# Patient Record
Sex: Female | Born: 1945 | Race: Black or African American | Hispanic: No | Smoking: Former smoker
Health system: Southern US, Community
[De-identification: ages and names within clinical notes are randomized; demographics above are authoritative.]

## PROBLEM LIST (undated history)

## (undated) DIAGNOSIS — R011 Cardiac murmur, unspecified: Secondary | ICD-10-CM

## (undated) DIAGNOSIS — M1990 Unspecified osteoarthritis, unspecified site: Secondary | ICD-10-CM

## (undated) DIAGNOSIS — J302 Other seasonal allergic rhinitis: Secondary | ICD-10-CM

## (undated) DIAGNOSIS — J45909 Unspecified asthma, uncomplicated: Secondary | ICD-10-CM

## (undated) DIAGNOSIS — Z8489 Family history of other specified conditions: Secondary | ICD-10-CM

## (undated) DIAGNOSIS — I10 Essential (primary) hypertension: Secondary | ICD-10-CM

## (undated) DIAGNOSIS — H269 Unspecified cataract: Secondary | ICD-10-CM

## (undated) DIAGNOSIS — T7840XA Allergy, unspecified, initial encounter: Secondary | ICD-10-CM

## (undated) DIAGNOSIS — Z5189 Encounter for other specified aftercare: Secondary | ICD-10-CM

## (undated) HISTORY — PX: CATARACT EXTRACTION: SUR2

## (undated) HISTORY — DX: Unspecified asthma, uncomplicated: J45.909

## (undated) HISTORY — DX: Encounter for other specified aftercare: Z51.89

## (undated) HISTORY — DX: Unspecified cataract: H26.9

## (undated) HISTORY — PX: TONSILLECTOMY: SUR1361

## (undated) HISTORY — DX: Cardiac murmur, unspecified: R01.1

## (undated) HISTORY — DX: Allergy, unspecified, initial encounter: T78.40XA

## (undated) HISTORY — PX: ABDOMINAL HYSTERECTOMY: SHX81

## (undated) HISTORY — PX: APPENDECTOMY: SHX54

---

## 2004-07-23 ENCOUNTER — Inpatient Hospital Stay (HOSPITAL_COMMUNITY): Admission: EM | Admit: 2004-07-23 | Discharge: 2004-07-25 | Payer: Self-pay | Admitting: Emergency Medicine

## 2006-05-15 ENCOUNTER — Ambulatory Visit: Payer: Self-pay

## 2007-03-18 ENCOUNTER — Emergency Department (HOSPITAL_COMMUNITY): Admission: EM | Admit: 2007-03-18 | Discharge: 2007-03-18 | Payer: Self-pay | Admitting: Emergency Medicine

## 2007-07-22 ENCOUNTER — Ambulatory Visit: Payer: Self-pay

## 2009-03-01 ENCOUNTER — Ambulatory Visit: Payer: Self-pay

## 2009-10-20 ENCOUNTER — Ambulatory Visit: Payer: Self-pay | Admitting: Emergency Medicine

## 2010-04-03 ENCOUNTER — Ambulatory Visit: Payer: Self-pay | Admitting: Family Medicine

## 2010-04-18 ENCOUNTER — Ambulatory Visit: Payer: Self-pay | Admitting: Family Medicine

## 2010-10-20 NOTE — H&P (Signed)
NAME:  AUBRINA, NIEMAN              ACCOUNT NO.:  [id number]   MEDICAL RECORD NO.:  [id number]          PATIENT TYPE:  INP   LOCATION:  A208                          FACILITY:  [facility]   PHYSICIAN:  Margaretmary Dys, M.D.DATE OF BIRTH:  12-11-1945   DATE OF ADMISSION:  07/23/2004  DATE OF DISCHARGE:  LH                                HISTORY & PHYSICAL   ADMISSION DIAGNOSES:  1.  Acute bronchitis.  2.  Fever.  3.  Hypoxic with respiratory failure.   CHIEF COMPLAINT:  Cough and fever of 2 days duration.   HISTORY OF PRESENT ILLNESS:  Mis Fenstermaker is a 65 year old African-American  female who recently moved from [place]. She does not have a primary care  physician at this time. She presented to the emergency department with  complaint of a 2-day history of cough productive of brownish sputum. She  also had some fever. She also felt dizzy. She had some nausea but no  vomiting. She gave complaint of dizziness but no headache or light  headedness. Denies any frequency, urgency or dysuria. She reports no one is  sick around her with flu-like symptoms.  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   Initial evaluation in the emergency room revealed significant fever with a  temperature of 103.5. A chest x-ray shows a peribronchial thickening but no  acute infiltrates were noted. She was however hypoxic with oxygen  saturations of 89% on room air. The plan is to admit her for bronchitis  which could probably develop pain and pneumonia.   REVIEW OF SYSTEMS:  Otherwise negative except as mentioned in the history of  present illness above.   PAST MEDICAL HISTORY:  1.  Arthritis.  2.  Back pain.  3.  Hypertension.   MEDICATIONS:  1.  Travatan which is blood pressure medication. The patient did not bring      the bottle and is unaware of the dose and I was unable to search for      this drug.  2.  _______  3.  Celebrex  4.  Singulair.   ALLERGIES:  PENICILLIN - causes a rash. CODEINE - causes hallucinations.   FAMILY HISTORY:  Significant for history of hypertension, diabetes, coronary  artery disease.   SOCIAL HISTORY:  The patient is married and has one son. Recently relocated  from [place] and lives around [place] now. She is originally from  [place]. Her husband is very sick with diabetes.  She quit smoking in  1987, was smoking about 1/2 pack/day. She drinks alcohol occasionally. She  denies any IV drug abuse or cocaine use.   PHYSICAL EXAMINATION:  GENERAL:  Alert and oriented, comfortable, in mild  respiratory distress.  VITAL SIGNS:  Blood pressure is 136/83, temperature on arrival in the  emergency room was 103.5, pulse 107, respiratory rate of 18, weight is 155  pounds. Oxygen saturations were 89% on room air. This improved to 94% on 2  liters.  HEENT:  Normocephalic, atraumatic. Oral mucosa was dry, no exudates were  noted. No  ulcers were seen.  NECK:  Supple, no jugular venous distension or lymphadenopathy.  LUNGS:  Clear clinically with good air entry bilaterally.  HEART:  S1, S2 regular, no S3, S4, gallops or rubs.  ABDOMEN:  Soft, nontender, bowel sounds were positive. No masses palpable.  EXTREMITIES:  No pitting pedal edema. No calf induration or tenderness was  noted.  CNS EXAM:  Was grossly nonfocal.   LABORATORY DATA:  White blood count was 6.4 thousand, hemoglobin 11.6,  hematocrit 33.3, platelet count 215,000, left shift with neutrophils of 90%.  Sodium is 130 with potassium 3.0, chloride of 99, CO2 of 24, glucose 146,  BUN of 11, creatinine 0.9. Liver function tests were normal.  Urinalysis was  positive for ketones. Positive protein. Leukocytes were negative.  Microscopic shows many bacteria, 3-6 white blood cell count's. Blood  cultures are pending.   Chest x-ray shows no evidence of acute infiltrate.   ASSESSMENT/PLAN:  Ms. Zea is a 65 year old female with symptoms   consistent with acute bronchitis and fever. No evidence of infiltrate on the  chest x-ray. She also has a fairly infected looking urine based on the urine  microscopy.  The plan will be to switch antibiotic coverage to Levaquin 750  IV daily from tomorrow. She does have a history of penicillin allergy and  there is a potential cross-reactivity with penicillin and cephalosporin.  Hopefully the Levaquin should also be able to cover her urinary tract  pathogens.   She does have mild hypoxia which was easily corrected with oxygen. Will  continue and titrate her oxygen to keep her saturations greater than 92%.  Her blood gas on arrival was 7.43, PO2 of 63.8, PCO2 39.9, bicarbonate 25.6,  saturations were 89.1%.   The patient is dehydrated and will hydrate her. She is also hypokalemic and  will replace her potassium.  It is unclear what the name of patient's  antihypertensive, however her blood pressure is fairly reasonable at this  time and will institute a different blood pressure pill if there is cause to  do so. At this time will monitor her blood pressure and await her family to  bring in her medication bottles.      AM/MEDQ  D:  07/23/2004  T:  07/23/2004  Job:  [id number]

## 2010-10-20 NOTE — Discharge Summary (Signed)
NAME:  LEYNA, VANDERKOLK              ACCOUNT NO.:  [id number]   MEDICAL RECORD NO.:  [id number]          PATIENT TYPE:  INP   LOCATION:  A208                          FACILITY:  [facility]   PHYSICIAN:  Calvert Cantor, M.D.     DATE OF BIRTH:  [birth year]   DATE OF ADMISSION:  07/23/2004  DATE OF DISCHARGE:  02/21/2006LH                                 DISCHARGE SUMMARY   PRIMARY CARE PHYSICIAN:  Dr. Bethena Midget.   DISCHARGE DIAGNOSES:  1.  Acute bronchitis.  2.  Hypoxia, secondary to acute bronchitis.  3.  Urinary tract infection.  4.  Diarrhea, status post antibiotic use.   DISCHARGE MEDICATIONS:  1.  Zithromax 500 mg p.o. daily x 3 days.  2.  Phenergan 12.5 mg p.o. q.4-6h. p.r.n. for nausea and vomiting.  3.  The patient has been told to use Pepto-Bismol for her diarrhea.   HOSPITAL COURSE:  This is a 65 year old African-American female who  presented to the ER with cough productive of brownish sputum, shortness of  breath, fever, dizziness and some nausea.  The patient was found to be  having an acute bronchitis episode with some hypoxia.  In addition to this,  she had a urinary tract infection.  She was started on Levaquin 750 mg  daily.  Her chest x-ray did not show any pneumonia.  Also, she was hydrated  with IV fluids.  Over the following day, the patient's condition improved.  She is less short of breath.  She is bringing up yellowish-white sputum now  with her cough, and she is not complaining of any dysuria.  However, she  does admit to diarrhea, which started last night.  She is having about 2-3  episodes through the night.  She said the diarrhea is not profuse; however,  it is loose.  She has had a stool sample drawn for C. diff, and she has been  started on Pepto-Bismol for her diarrhea.  Her antibiotic on discharge is  going to be Zithromax, and, in addition, she will have Phenergan for her  nausea and vomiting.  She states that she has an appointment with Dr.  Bethena Midget for this Friday, who can follow up with her on her acute  bronchitis and diarrhea.  The C. diff will be followed.  If it is positive,  then the patient will be called, and appropriate medication will be  prescribed.   DISCHARGE INSTRUCTIONS:  1.  Drink 6-8 glasses of fluid per day, preferably water.  2.  Take medications as prescribed.      SR/MEDQ  D:  07/25/2004  T:  07/25/2004  Job:  [id number]   cc:   Billee Cashing, MD  [address]  [place]  [place] [address]

## 2012-01-07 ENCOUNTER — Emergency Department: Payer: Self-pay | Admitting: Emergency Medicine

## 2013-10-15 ENCOUNTER — Ambulatory Visit: Payer: Self-pay | Admitting: Family Medicine

## 2013-10-15 DIAGNOSIS — I359 Nonrheumatic aortic valve disorder, unspecified: Secondary | ICD-10-CM

## 2015-07-10 ENCOUNTER — Encounter (HOSPITAL_COMMUNITY): Payer: Self-pay | Admitting: Emergency Medicine

## 2015-07-10 ENCOUNTER — Observation Stay (HOSPITAL_COMMUNITY)
Admission: EM | Admit: 2015-07-10 | Discharge: 2015-07-11 | Disposition: A | Discharge status: Home / Self Care | Payer: Medicare HMO | Attending: Internal Medicine | Admitting: Internal Medicine

## 2015-07-10 ENCOUNTER — Emergency Department (HOSPITAL_COMMUNITY)
Admission: EM | Admit: 2015-07-10 | Discharge: 2015-07-10 | Disposition: A | Discharge status: Home / Self Care | Payer: Medicare HMO | Source: Home / Self Care | Attending: Emergency Medicine | Admitting: Emergency Medicine

## 2015-07-10 ENCOUNTER — Emergency Department (HOSPITAL_COMMUNITY): Payer: Medicare HMO

## 2015-07-10 ENCOUNTER — Encounter (HOSPITAL_COMMUNITY): Payer: Self-pay

## 2015-07-10 DIAGNOSIS — J3489 Other specified disorders of nose and nasal sinuses: Secondary | ICD-10-CM | POA: Diagnosis not present

## 2015-07-10 DIAGNOSIS — R531 Weakness: Secondary | ICD-10-CM | POA: Diagnosis present

## 2015-07-10 DIAGNOSIS — R6889 Other general symptoms and signs: Secondary | ICD-10-CM

## 2015-07-10 DIAGNOSIS — R55 Syncope and collapse: Secondary | ICD-10-CM | POA: Insufficient documentation

## 2015-07-10 DIAGNOSIS — I10 Essential (primary) hypertension: Secondary | ICD-10-CM | POA: Insufficient documentation

## 2015-07-10 DIAGNOSIS — Z79899 Other long term (current) drug therapy: Secondary | ICD-10-CM | POA: Insufficient documentation

## 2015-07-10 DIAGNOSIS — J40 Bronchitis, not specified as acute or chronic: Secondary | ICD-10-CM | POA: Insufficient documentation

## 2015-07-10 DIAGNOSIS — R0602 Shortness of breath: Secondary | ICD-10-CM | POA: Insufficient documentation

## 2015-07-10 DIAGNOSIS — R5383 Other fatigue: Secondary | ICD-10-CM | POA: Insufficient documentation

## 2015-07-10 DIAGNOSIS — M1990 Unspecified osteoarthritis, unspecified site: Secondary | ICD-10-CM | POA: Insufficient documentation

## 2015-07-10 DIAGNOSIS — R05 Cough: Secondary | ICD-10-CM | POA: Diagnosis present

## 2015-07-10 DIAGNOSIS — R062 Wheezing: Secondary | ICD-10-CM

## 2015-07-10 DIAGNOSIS — R011 Cardiac murmur, unspecified: Secondary | ICD-10-CM | POA: Diagnosis present

## 2015-07-10 DIAGNOSIS — R509 Fever, unspecified: Secondary | ICD-10-CM

## 2015-07-10 DIAGNOSIS — Z880 Allergy status to penicillin: Secondary | ICD-10-CM | POA: Diagnosis not present

## 2015-07-10 DIAGNOSIS — Z792 Long term (current) use of antibiotics: Secondary | ICD-10-CM | POA: Insufficient documentation

## 2015-07-10 DIAGNOSIS — I341 Nonrheumatic mitral (valve) prolapse: Secondary | ICD-10-CM | POA: Diagnosis present

## 2015-07-10 DIAGNOSIS — Z8739 Personal history of other diseases of the musculoskeletal system and connective tissue: Secondary | ICD-10-CM | POA: Insufficient documentation

## 2015-07-10 DIAGNOSIS — R054 Cough syncope: Secondary | ICD-10-CM | POA: Diagnosis present

## 2015-07-10 HISTORY — DX: Unspecified osteoarthritis, unspecified site: M19.90

## 2015-07-10 HISTORY — DX: Other seasonal allergic rhinitis: J30.2

## 2015-07-10 HISTORY — DX: Essential (primary) hypertension: I10

## 2015-07-10 MED ORDER — IBUPROFEN 800 MG PO TABS
800.0000 mg | ORAL_TABLET | Freq: Once | ORAL | Status: AC
  Administered 2015-07-10: 800 mg via ORAL

## 2015-07-10 MED ORDER — ACETAMINOPHEN 325 MG PO TABS: 650.0000 mg | ORAL_TABLET | ORAL | Status: DC | PRN

## 2015-07-10 MED ORDER — IBUPROFEN 800 MG PO TABS
ORAL_TABLET | ORAL | Status: AC
  Filled 2015-07-10: qty 1

## 2015-07-10 MED ORDER — ALBUTEROL SULFATE (2.5 MG/3ML) 0.083% IN NEBU
5.0000 mg | INHALATION_SOLUTION | Freq: Once | RESPIRATORY_TRACT | Status: AC
  Administered 2015-07-10: 5 mg via RESPIRATORY_TRACT
  Filled 2015-07-10: qty 6

## 2015-07-10 MED ORDER — ALBUTEROL SULFATE HFA 108 (90 BASE) MCG/ACT IN AERS
INHALATION_SPRAY | RESPIRATORY_TRACT | Status: AC
  Administered 2015-07-10: 21:00:00
  Filled 2015-07-10: qty 6.7

## 2015-07-10 MED ORDER — PREDNISONE 20 MG PO TABS: 40.0000 mg | ORAL_TABLET | Freq: Every day | ORAL | Status: DC

## 2015-07-10 MED ORDER — OSELTAMIVIR PHOSPHATE 75 MG PO CAPS
75.0000 mg | ORAL_CAPSULE | Freq: Once | ORAL | Status: AC
Start: 2015-07-10 — End: 2015-07-10
  Administered 2015-07-10: 75 mg via ORAL
  Filled 2015-07-10: qty 1

## 2015-07-10 MED ORDER — OSELTAMIVIR PHOSPHATE 75 MG PO CAPS: 75.0000 mg | ORAL_CAPSULE | Freq: Two times a day (BID) | ORAL | Status: DC

## 2015-07-10 MED ORDER — BENZONATATE 100 MG PO CAPS
100.0000 mg | ORAL_CAPSULE | Freq: Once | ORAL | Status: AC
  Administered 2015-07-10: 100 mg via ORAL
  Filled 2015-07-10: qty 1

## 2015-07-10 NOTE — ED Notes (Signed)
Patient was just discharged from ED. When patient got into the car, she said she took a bite of food and "everything went black" this nurse was called out to vehicle and patient was sitting up in car, awake, and responsive and able to recall events. Patient brought back into ED and states "im tired"

## 2015-07-10 NOTE — Discharge Instructions (Signed)
Bronchospasm, Adult A bronchospasm is when the tubes that carry air in and out of your lungs (airways) spasm or tighten. During a bronchospasm it is hard to breathe. This is because the airways get smaller. A bronchospasm can be triggered by: Allergies. These may be to animals, pollen, food, or mold. Infection. This is a common cause of bronchospasm. Exercise. Irritants. These include pollution, cigarette smoke, strong odors, aerosol sprays, and paint fumes. Weather changes. Stress. Being emotional. HOME CARE  Always have a plan for getting help. Know when to call your doctor and local emergency services (911 in the [place] Know where you can get emergency care. Only take medicines as told by your doctor. If you were prescribed an inhaler or nebulizer machine, ask your doctor how to use it correctly. Always use a spacer with your inhaler if you were given one. Stay calm during an attack. Try to relax and breathe more slowly. Control your home environment: Change your heating and air conditioning filter at least once a month. Limit your use of fireplaces and wood stoves. Do not  smoke. Do not  allow smoking in your home. Avoid perfumes and fragrances. Get rid of pests (such as roaches and mice) and their droppings. Throw away plants if you see mold on them. Keep your house clean and dust free. Replace carpet with wood, tile, or vinyl flooring. Carpet can trap dander and dust. Use allergy-proof pillows, mattress covers, and box spring covers. Wash bed sheets and blankets every week in hot water. Dry them in a dryer. Use blankets that are made of polyester or cotton. Wash hands frequently. GET HELP IF: You have muscle aches. You have chest pain. The thick spit you spit or cough up (sputum) changes from clear or white to yellow, green, gray, or bloody. The thick spit you spit or cough up gets thicker. There are problems that may be related to the medicine you are given such as: A  rash. Itching. Swelling. Trouble breathing. GET HELP RIGHT AWAY IF: You feel you cannot breathe or catch your breath. You cannot stop coughing. Your treatment is not helping you breathe better. You have very bad chest pain. MAKE SURE YOU:  Understand these instructions. Will watch your condition. Will get help right away if you are not doing well or get worse.   This information is not intended to replace advice given to you by your health care provider. Make sure you discuss any questions you have with your health care provider.   Document Released: 03/18/2009 Document Revised: 06/11/2014 Document Reviewed: 11/11/2012 Elsevier Interactive Patient Education 2016 [employer] Influenza, Adult Influenza ("the flu") is a viral infection of the respiratory tract. It occurs more often in winter months because people spend more time in close contact with one another. Influenza can make you feel very sick. Influenza easily spreads from person to person (contagious). CAUSES  Influenza is caused by a virus that infects the respiratory tract. You can catch the virus by breathing in droplets from an infected person's cough or sneeze. You can also catch the virus by touching something that was recently contaminated with the virus and then touching your mouth, nose, or eyes. RISKS AND COMPLICATIONS You may be at risk for a more severe case of influenza if you smoke cigarettes, have diabetes, have chronic heart disease (such as heart failure) or lung disease (such as asthma), or if you have a weakened immune system. Elderly people and pregnant women are also at risk for more serious  infections. The most common problem of influenza is a lung infection (pneumonia). Sometimes, this problem can require emergency medical care and may be life threatening. SIGNS AND SYMPTOMS  Symptoms typically last 4 to 10 days and may include:  Fever.  Chills.  Headache, body aches, and muscle aches.  Sore  throat.  Chest discomfort and cough.  Poor appetite.  Weakness or feeling tired.  Dizziness.  Nausea or vomiting. DIAGNOSIS  Diagnosis of influenza is often made based on your history and a physical exam. A nose or throat swab test can be done to confirm the diagnosis. TREATMENT  In mild cases, influenza goes away on its own. Treatment is directed at relieving symptoms. For more severe cases, your health care provider may prescribe antiviral medicines to shorten the sickness. Antibiotic medicines are not effective because the infection is caused by a virus, not by bacteria. HOME CARE INSTRUCTIONS  Take medicines only as directed by your health care provider.  Use a cool mist humidifier to make breathing easier.  Get plenty of rest until your temperature returns to normal. This usually takes 3 to 4 days.  Drink enough fluid to keep your urine clear or pale yellow.  Cover yourmouth and nosewhen coughing or sneezing,and wash your handswellto prevent thevirusfrom spreading.  Stay homefromwork orschool untilthe fever is gonefor at least 62full day. PREVENTION  An annual influenza vaccination (flu shot) is the best way to avoid getting influenza. An annual flu shot is now routinely recommended for all adults in the [place] IF:  You experiencechest pain, yourcough worsens,or you producemore mucus.  Youhave nausea,vomiting, ordiarrhea.  Your fever returns or gets worse. SEEK IMMEDIATE MEDICAL CARE IF:  You havetrouble breathing, you become short of breath,or your skin ornails becomebluish.  You have severe painor stiffnessin the neck.  You develop a sudden headache, or pain in the face or ear.  You have nausea or vomiting that you cannot control. MAKE SURE YOU:   Understand these instructions.  Will watch your condition.  Will get help right away if you are not doing well or get worse.   This information is not intended to replace  advice given to you by your health care provider. Make sure you discuss any questions you have with your health care provider.   Document Released: 05/18/2000 Document Revised: 06/11/2014 Document Reviewed: 08/20/2011 Elsevier Interactive Patient Education [employer].

## 2015-07-10 NOTE — ED Provider Notes (Signed)
CSN: [id number]     Arrival date & time 07/10/15  2253 History  By signing my name below, I, Eustaquio Maize, attest that this documentation has been prepared under the direction and in the presence of Rolland Porter, MD at 2320. Electronically Signed: Eustaquio Maize, ED Scribe. 07/11/2015. 12:13 AM.   Chief Complaint  Patient presents with  . Weakness   The history is provided by the patient. No language interpreter was used.     HPI Comments: Elizabeth Branch is a 70 y.o. female with hx heart murmur who presents to the Emergency Department complaining of syncopal episode that occurred PTA. Pt was seen in the ED earlier today for a dry cough, chills, and rhinorrhea. She had a CXR with no acute findings. Pt also had a breathing treatment administered and was discharged home with a diagnosis of flu-like symptoms. Pt reports that she went to [employer] after being discharged and was eating a sandwich when she began feeling like she was going to pass out. Family member decided to drive pt back over to the ED for further evaluation. He reports that on the way pt began gagging like she was going to vomit and then passed out. The syncopal episode lasted about 10 - 30 seconds. Pt states that she remembers all the events leading up to the episode. She reports hx of recurrent syncopal episodes but does not know the cause of them. The last time she passed out was about 7 months ago. She states they started when she was in school. She states she can feel when they're going to happen. Her companion states right before she passed out she said she felt like she was going to pass out. She also complains of mild shortness of breath and a fever that she noticed when her vitals were taken earlier today in the ED. Pt denies headache, chest pain, sore throat, nausea, vomiting, or any other associated symptoms. She states currently she only feels tired.  PCP [facility]  Past Medical History  Diagnosis  Date  . Arthritis   . Hypertension   . Seasonal allergies    Past Surgical History  Procedure Laterality Date  . Abdominal hysterectomy    . Tonsillectomy    . Appendectomy    . Cataract extraction     History reviewed. No pertinent family history. Social History  Substance Use Topics  . Smoking status: Never Smoker   . Smokeless tobacco: None  . Alcohol Use: Yes     Comment: occassional  lives at home Lives alone Uses a cane  OB History    Gravida Para Term Preterm AB TAB SAB Ectopic Multiple Living   1         1     Review of Systems  Constitutional: Positive for fever and fatigue.  HENT: Positive for rhinorrhea. Negative for sore throat.   Respiratory: Positive for cough and shortness of breath.   Cardiovascular: Negative for chest pain.  Gastrointestinal: Negative for nausea and vomiting.  Neurological: Positive for syncope. Negative for headaches.  All other systems reviewed and are negative.     Allergies  Codeine; Naproxen; and Penicillins  Home Medications   Prior to Admission medications   Medication Sig Start Date End Date Taking? Authorizing Provider  acetaminophen (TYLENOL) 500 MG tablet Take 500 mg by mouth every 6 (six) hours as needed for mild pain or moderate pain.   Yes Historical Provider, MD  albuterol (PROVENTIL HFA;VENTOLIN HFA) 108 (90  Base) MCG/ACT inhaler Inhale 1-2 puffs into the lungs every 6 (six) hours as needed for wheezing or shortness of breath.   Yes Historical Provider, MD  atenolol (TENORMIN) 25 MG tablet Take 37.5 mg by mouth daily.   Yes Historical Provider, MD  celecoxib (CELEBREX) 100 MG capsule Take 100 mg by mouth daily. *May take one additional capsule as needed for pain if active*   Yes Historical Provider, MD  cephALEXin (KEFLEX) 250 MG capsule Take 250 mg by mouth daily.   Yes Historical Provider, MD  hydrochlorothiazide (HYDRODIURIL) 12.5 MG tablet Take 25 mg by mouth daily.   Yes Historical Provider, MD  loratadine  (CLARITIN) 10 MG tablet Take 10 mg by mouth every morning.   Yes Historical Provider, MD  magnesium oxide (MAG-OX) 400 MG tablet Take 400 mg by mouth daily.   Yes Historical Provider, MD  Misc Natural Products (OSTEO BI-FLEX JOINT SHIELD PO) Take 1 tablet by mouth daily.   Yes Historical Provider, MD  montelukast (SINGULAIR) 10 MG tablet Take 10 mg by mouth at bedtime.   Yes Historical Provider, MD  Omega-3 Fatty Acids (FISH OIL) 1000 MG CAPS Take 1 capsule by mouth daily.   Yes Historical Provider, MD  Polyethyl Glycol-Propyl Glycol (SYSTANE) 0.4-0.3 % GEL ophthalmic gel Place 1 application into both eyes daily.   Yes Historical Provider, MD  Polyethyl Glycol-Propyl Glycol (SYSTANE) 0.4-0.3 % SOLN Apply 1 drop to eye daily.   Yes Historical Provider, MD  oseltamivir (TAMIFLU) 75 MG capsule Take 1 capsule (75 mg total) by mouth every 12 (twelve) hours. 07/10/15   Julianne Rice, MD  predniSONE (DELTASONE) 20 MG tablet Take 2 tablets (40 mg total) by mouth daily. 07/10/15   Julianne Rice, MD   BP 146/49 mmHg  Pulse 87  Temp(Src) 99.7 F (37.6 C) (Oral)  Resp 28  Ht 5\' 2"  (1.575 m)  Wt 170 lb (77.111 kg)  BMI 31.09 kg/m2  SpO2 93%  Vital signs normal     Physical Exam  Constitutional: She is oriented to person, place, and time. She appears well-developed and well-nourished.  Non-toxic appearance. She does not appear ill. No distress.  HENT:  Head: Normocephalic and atraumatic.  Right Ear: External ear normal.  Left Ear: External ear normal.  Nose: Nose normal. No mucosal edema or rhinorrhea.  Mouth/Throat: Oropharynx is clear and moist and mucous membranes are normal. No dental abscesses or uvula swelling.  Eyes: Conjunctivae and EOM are normal. Pupils are equal, round, and reactive to light.  Neck: Normal range of motion and full passive range of motion without pain. Neck supple.  Cardiovascular: Normal rate, regular rhythm and normal heart sounds.  Exam reveals no gallop and no  friction rub.   No murmur heard. Pulmonary/Chest: Effort normal and breath sounds normal. No respiratory distress. She has no wheezes. She has no rhonchi. She has no rales. She exhibits no tenderness and no crepitus.  Abdominal: Soft. Normal appearance and bowel sounds are normal. She exhibits no distension. There is no tenderness. There is no rebound and no guarding.  Musculoskeletal: Normal range of motion. She exhibits no edema or tenderness.  Moves all extremities well.   Neurological: She is alert and oriented to person, place, and time. She has normal strength. No cranial nerve deficit.  Skin: Skin is warm, dry and intact. No rash noted. No erythema. No pallor.  Skin feels hot to touch  Psychiatric: She has a normal mood and affect. Her speech is normal and behavior  is normal. Her mood appears not anxious.  Nursing note and vitals reviewed.   ED Course  Procedures (including critical care time)  Medications  dextromethorphan-guaiFENesin (MUCINEX DM) 30-600 MG per 12 hr tablet 1 tablet (1 tablet Oral Given 07/11/15 0147)  potassium chloride SA (K-DUR,KLOR-CON) CR tablet 40 mEq (40 mEq Oral Given 07/11/15 0147)     DIAGNOSTIC STUDIES: Oxygen Saturation is 94% on RA, adequate by my interpretation.    COORDINATION OF CARE: 12:09 AM-Discussed treatment plan which includes CBC, CMP, UA, troponin, lactic acid with pt at bedside and pt agreed to plan.   12:38 AM - Friend reports pt is coughing a lot and that is when pt feels like she is going to pass out.   Patient noted to be given assistance by nursing staff to go to the bathroom. She normally uses a cane and required a lot of assistance.  01:39 Orthostatic Vital Signs AD  Orthostatic Lying  - BP- Lying: 135/55 mmHg ; Pulse- Lying: 83  Orthostatic Sitting - BP- Sitting: 139/51 mmHg ; Pulse- Sitting: 86  Orthostatic Standing at 0 minutes - BP- Standing at 0 minutes: 144/56 mmHg ; Pulse- Standing at 0 minutes: 88     Orthostatic vital  signs were normal.  Patient was given oral potassium for her hypokalemia and given Mucinex DM for her cough. Patient has a history of heart murmur and syncope. She was seen in the ED twice within a couple hours tonight. She is agreeable to be admitted for further evaluation.  01:48 Dr Marin Comment, will see patient and do bed request for admission  Labs Review Results for orders placed or performed during the hospital encounter of 07/10/15  CBC with Differential/Platelet  Result Value Ref Range   WBC 6.3 4.0 - 10.5 K/uL   RBC 4.12 3.87 - 5.11 MIL/uL   Hemoglobin 12.0 12.0 - 15.0 g/dL   HCT 36.5 36.0 - 46.0 %   MCV 88.6 78.0 - 100.0 fL   MCH 29.1 26.0 - 34.0 pg   MCHC 32.9 30.0 - 36.0 g/dL   RDW 12.4 11.5 - 15.5 %   Platelets 191 150 - 400 K/uL   Neutrophils Relative % 87 %   Neutro Abs 5.5 1.7 - 7.7 K/uL   Lymphocytes Relative 5 %   Lymphs Abs 0.3 (L) 0.7 - 4.0 K/uL   Monocytes Relative 7 %   Monocytes Absolute 0.5 0.1 - 1.0 K/uL   Eosinophils Relative 1 %   Eosinophils Absolute 0.0 0.0 - 0.7 K/uL   Basophils Relative 0 %   Basophils Absolute 0.0 0.0 - 0.1 K/uL  Comprehensive metabolic panel  Result Value Ref Range   Sodium 137 135 - 145 mmol/L   Potassium 3.3 (L) 3.5 - 5.1 mmol/L   Chloride 102 101 - 111 mmol/L   CO2 27 22 - 32 mmol/L   Glucose, Bld 139 (H) 65 - 99 mg/dL   BUN 18 6 - 20 mg/dL   Creatinine, Ser 0.89 0.44 - 1.00 mg/dL   Calcium 9.0 8.9 - 10.3 mg/dL   Total Protein 7.4 6.5 - 8.1 g/dL   Albumin 4.1 3.5 - 5.0 g/dL   AST 28 15 - 41 U/L   ALT 17 14 - 54 U/L   Alkaline Phosphatase 87 38 - 126 U/L   Total Bilirubin 1.1 0.3 - 1.2 mg/dL   GFR calc non Af Amer >60 >60 mL/min   GFR calc Af Amer >60 >60 mL/min   Anion gap 8 5 -  15  Urinalysis, Routine w reflex microscopic (not at [facility])  Result Value Ref Range   Color, Urine YELLOW YELLOW   APPearance CLEAR CLEAR   Specific Gravity, Urine 1.020 1.005 - 1.030   pH 5.5 5.0 - 8.0   Glucose, UA NEGATIVE NEGATIVE mg/dL    Hgb urine dipstick MODERATE (A) NEGATIVE   Bilirubin Urine NEGATIVE NEGATIVE   Ketones, ur 15 (A) NEGATIVE mg/dL   Protein, ur NEGATIVE NEGATIVE mg/dL   Nitrite NEGATIVE NEGATIVE   Leukocytes, UA NEGATIVE NEGATIVE  Urine microscopic-add on  Result Value Ref Range   Squamous Epithelial / LPF 0-5 (A) NONE SEEN   WBC, UA 0-5 0 - 5 WBC/hpf   RBC / HPF 0-5 0 - 5 RBC/hpf   Bacteria, UA RARE (A) NONE SEEN  I-stat troponin, ED  Result Value Ref Range   Troponin i, poc 0.00 0.00 - 0.08 ng/mL   Comment 3          I-Stat CG4 Lactic Acid, ED  Result Value Ref Range   Lactic Acid, Venous 1.60 0.5 - 2.0 mmol/L   Laboratory interpretation all normal except mild hypokalemia    Imaging Review  Dg Chest 2 View  07/11/2015  CLINICAL DATA:  Syncopal episode prior to arrival. Seen earlier today for cough. History of hypertension. EXAM: CHEST  2 VIEW COMPARISON:  Chest radiograph July 10, 2015 at 1835 hours FINDINGS: Cardiomediastinal silhouette is normal. The lungs are clear without pleural effusions or focal consolidations. Trachea projects midline and there is no pneumothorax. Soft tissue planes and included osseous structures are non-suspicious. IMPRESSION: No acute cardiopulmonary process. Electronically Signed   By: Elon Alas M.D.   On: 07/11/2015 00:05    Dg Chest 2 View  07/10/2015  CLINICAL DATA:  70 year old female with cough. EXAM: CHEST  2 VIEW COMPARISON:  None. FINDINGS: Two views of the chest demonstrate emphysematous changes of the lungs. There are linear and platelike atelectasis predominantly involving the lower lung fields. No focal consolidation, pleural effusion, or pneumothorax. The cardiac silhouette is within normal limits. No acute osseous pathology. IMPRESSION: No active cardiopulmonary disease. Electronically Signed   By: Anner Crete M.D.   On: 07/10/2015 19:12   I have personally reviewed and evaluated these images and lab results as part of my medical  decision-making.   EKG Interpretation   Date/Time:  Sunday July 10 2015 23:39:11 EST Ventricular Rate:  84 PR Interval:  162 QRS Duration: 92 QT Interval:  376 QTC Calculation: 444 R Axis:   49 Text Interpretation:  Sinus rhythm Normal ECG No old tracing to compare  Confirmed by Carole Deere  MD-I, Arrin Ishler ([id number]) on 07/11/2015 12:39:51 AM      MDM   Final diagnoses:  Syncope, unspecified syncope type  Bronchitis  Weakness    Plan admission  Rolland Porter, MD, FACEP   I personally performed the services described in this documentation, which was scribed in my presence. The recorded information has been reviewed and considered.  Rolland Porter, MD, Barbette Or, MD 07/11/15 [phone]

## 2015-07-10 NOTE — ED Provider Notes (Signed)
CSN: [id number]     Arrival date & time 07/10/15  1816 History   First MD Initiated Contact with Patient 07/10/15 1935     Chief Complaint  Patient presents with  . Cough     (Consider location/radiation/quality/duration/timing/severity/associated sxs/prior Treatment) HPI Patient presents with one day of fever, diffuse myalgias and cough productive of clear sputum. Denies headache, neck pain, sore throat, nasal congestion, abdominal pain, nausea, vomiting, diarrhea or urinary symptoms. No new rashes. No sick contacts. Patient states she did have her flu shot but not her pneumonia shot this year. Past Medical History  Diagnosis Date  . Arthritis   . Hypertension   . Seasonal allergies    Past Surgical History  Procedure Laterality Date  . Abdominal hysterectomy    . Tonsillectomy    . Appendectomy    . Cataract extraction     History reviewed. No pertinent family history. Social History  Substance Use Topics  . Smoking status: Never Smoker   . Smokeless tobacco: None  . Alcohol Use: Yes     Comment: occassional   OB History    Gravida Para Term Preterm AB TAB SAB Ectopic Multiple Living   1         1     Review of Systems  Constitutional: Positive for fever. Negative for chills and fatigue.  HENT: Negative for congestion, rhinorrhea, sinus pressure and sore throat.   Respiratory: Positive for cough and wheezing. Negative for shortness of breath.   Gastrointestinal: Negative for nausea, vomiting, abdominal pain, diarrhea and constipation.  Musculoskeletal: Negative for myalgias, back pain, neck pain and neck stiffness.  Skin: Negative for rash and wound.  Neurological: Negative for dizziness, weakness, light-headedness, numbness and headaches.  All other systems reviewed and are negative.     Allergies  Review of patient's allergies indicates no known allergies.  Home Medications   Prior to Admission medications   Medication Sig Start Date End Date Taking?  Authorizing Provider  oseltamivir (TAMIFLU) 75 MG capsule Take 1 capsule (75 mg total) by mouth every 12 (twelve) hours. 07/10/15   Julianne Rice, MD  predniSONE (DELTASONE) 20 MG tablet Take 2 tablets (40 mg total) by mouth daily. 07/10/15   Julianne Rice, MD   BP 160/54 mmHg  Pulse 81  Temp(Src) 101.8 F (38.8 C) (Oral)  Ht 5\' 2"  (1.575 m)  Wt 167 lb (75.751 kg)  BMI 30.54 kg/m2  SpO2 98% Physical Exam  Constitutional: She is oriented to person, place, and time. She appears well-developed and well-nourished. No distress.  Very well-appearing. No distress.  HENT:  Head: Normocephalic and atraumatic.  Mouth/Throat: Oropharynx is clear and moist. No oropharyngeal exudate.  No sinus tenderness with percussion.  Eyes: EOM are normal. Pupils are equal, round, and reactive to light.  Neck: Normal range of motion. Neck supple. No JVD present.  No meningismus  Cardiovascular: Normal rate and regular rhythm.   Pulmonary/Chest: Effort normal. No respiratory distress. She has wheezes (mild expiratory wheeze in the left lung base). She has no rales.  Abdominal: Soft. Bowel sounds are normal. She exhibits no distension and no mass. There is no tenderness. There is no rebound and no guarding.  Musculoskeletal: Normal range of motion. She exhibits no edema or tenderness.  No CVA tenderness bilaterally.  Lymphadenopathy:    She has no cervical adenopathy.  Neurological: She is alert and oriented to person, place, and time.  Skin: Skin is warm and dry. No rash noted. No erythema.  Psychiatric:  She has a normal mood and affect. Her behavior is normal.  Nursing note and vitals reviewed.   ED Course  Procedures (including critical care time) Labs Review Labs Reviewed - No data to display  Imaging Review Dg Chest 2 View  07/10/2015  CLINICAL DATA:  70 year old female with cough. EXAM: CHEST  2 VIEW COMPARISON:  None. FINDINGS: Two views of the chest demonstrate emphysematous changes of the  lungs. There are linear and platelike atelectasis predominantly involving the lower lung fields. No focal consolidation, pleural effusion, or pneumothorax. The cardiac silhouette is within normal limits. No acute osseous pathology. IMPRESSION: No active cardiopulmonary disease. Electronically Signed   By: Anner Crete M.D.   On: 07/10/2015 19:12   I have personally reviewed and evaluated these images and lab results as part of my medical decision-making.   EKG Interpretation None      MDM   Final diagnoses:  Flu-like symptoms  Wheezing    Patient continues to be well-appearing. Wheezing improved after breathing treatment. Concern for possible influenza. Given dose of Tamiflu in the emergency department. We'll discharge home with short course of steroids and Tamiflu and albuterol inhaler. Return precautions have been given. She understands need to follow-up with her primary physician.    Julianne Rice, MD 07/10/15 2203

## 2015-07-10 NOTE — ED Notes (Signed)
PT c/o fever, chills and productive clear sputum cough x1 day. PT states tylenol at 1400 today.

## 2015-07-11 ENCOUNTER — Encounter (HOSPITAL_COMMUNITY): Payer: Self-pay | Admitting: Internal Medicine

## 2015-07-11 ENCOUNTER — Observation Stay (HOSPITAL_COMMUNITY): Payer: Medicare HMO

## 2015-07-11 DIAGNOSIS — R05 Cough: Secondary | ICD-10-CM | POA: Diagnosis not present

## 2015-07-11 DIAGNOSIS — R011 Cardiac murmur, unspecified: Secondary | ICD-10-CM

## 2015-07-11 DIAGNOSIS — R054 Cough syncope: Secondary | ICD-10-CM | POA: Diagnosis present

## 2015-07-11 DIAGNOSIS — I10 Essential (primary) hypertension: Secondary | ICD-10-CM

## 2015-07-11 DIAGNOSIS — I341 Nonrheumatic mitral (valve) prolapse: Secondary | ICD-10-CM | POA: Diagnosis present

## 2015-07-11 LAB — URINALYSIS, ROUTINE W REFLEX MICROSCOPIC
Bilirubin Urine: NEGATIVE
GLUCOSE, UA: NEGATIVE mg/dL
KETONES UR: 15 mg/dL — AB
LEUKOCYTES UA: NEGATIVE
NITRITE: NEGATIVE
PROTEIN: NEGATIVE mg/dL
Specific Gravity, Urine: 1.02 (ref 1.005–1.030)
pH: 5.5 (ref 5.0–8.0)

## 2015-07-11 LAB — CBC WITH DIFFERENTIAL/PLATELET
BASOS PCT: 0 %
Basophils Absolute: 0 10*3/uL (ref 0.0–0.1)
EOS ABS: 0 10*3/uL (ref 0.0–0.7)
Eosinophils Relative: 1 %
HCT: 36.5 % (ref 36.0–46.0)
Hemoglobin: 12 g/dL (ref 12.0–15.0)
LYMPHS ABS: 0.3 10*3/uL — AB (ref 0.7–4.0)
Lymphocytes Relative: 5 %
MCH: 29.1 pg (ref 26.0–34.0)
MCHC: 32.9 g/dL (ref 30.0–36.0)
MCV: 88.6 fL (ref 78.0–100.0)
MONO ABS: 0.5 10*3/uL (ref 0.1–1.0)
MONOS PCT: 7 %
Neutro Abs: 5.5 10*3/uL (ref 1.7–7.7)
Neutrophils Relative %: 87 %
Platelets: 191 10*3/uL (ref 150–400)
RBC: 4.12 MIL/uL (ref 3.87–5.11)
RDW: 12.4 % (ref 11.5–15.5)
WBC: 6.3 10*3/uL (ref 4.0–10.5)

## 2015-07-11 LAB — COMPREHENSIVE METABOLIC PANEL
ALBUMIN: 4.1 g/dL (ref 3.5–5.0)
ALT: 17 U/L (ref 14–54)
ANION GAP: 8 (ref 5–15)
AST: 28 U/L (ref 15–41)
Alkaline Phosphatase: 87 U/L (ref 38–126)
BILIRUBIN TOTAL: 1.1 mg/dL (ref 0.3–1.2)
BUN: 18 mg/dL (ref 6–20)
CALCIUM: 9 mg/dL (ref 8.9–10.3)
CO2: 27 mmol/L (ref 22–32)
Chloride: 102 mmol/L (ref 101–111)
Creatinine, Ser: 0.89 mg/dL (ref 0.44–1.00)
GFR calc non Af Amer: >60 mL/min (ref >60)
GLUCOSE: 139 mg/dL — AB (ref 65–99)
POTASSIUM: 3.3 mmol/L — AB (ref 3.5–5.1)
SODIUM: 137 mmol/L (ref 135–145)
TOTAL PROTEIN: 7.4 g/dL (ref 6.5–8.1)

## 2015-07-11 LAB — I-STAT CG4 LACTIC ACID, ED: LACTIC ACID, VENOUS: 1.6 mmol/L (ref 0.5–2.0)

## 2015-07-11 LAB — TROPONIN I

## 2015-07-11 LAB — URINE MICROSCOPIC-ADD ON

## 2015-07-11 LAB — CREATININE, SERUM
CREATININE: 0.77 mg/dL (ref 0.44–1.00)
GFR calc Af Amer: >60 mL/min (ref >60)

## 2015-07-11 LAB — CBC
HCT: 34.5 % — ABNORMAL LOW (ref 36.0–46.0)
HEMOGLOBIN: 11.2 g/dL — AB (ref 12.0–15.0)
MCH: 28.6 pg (ref 26.0–34.0)
MCHC: 32.5 g/dL (ref 30.0–36.0)
MCV: 88.2 fL (ref 78.0–100.0)
PLATELETS: 200 10*3/uL (ref 150–400)
RBC: 3.91 MIL/uL (ref 3.87–5.11)
RDW: 12.6 % (ref 11.5–15.5)
WBC: 5.2 10*3/uL (ref 4.0–10.5)

## 2015-07-11 LAB — I-STAT TROPONIN, ED: TROPONIN I, POC: 0 ng/mL (ref 0.00–0.08)

## 2015-07-11 MED ORDER — POTASSIUM CHLORIDE IN NACL 20-0.9 MEQ/L-% IV SOLN
INTRAVENOUS | Status: DC
  Administered 2015-07-11: 100 mL/h via INTRAVENOUS

## 2015-07-11 MED ORDER — ATENOLOL 25 MG PO TABS
37.5000 mg | ORAL_TABLET | Freq: Every day | ORAL | Status: DC
  Administered 2015-07-11: 37.5 mg via ORAL
  Filled 2015-07-11: qty 2

## 2015-07-11 MED ORDER — GUAIFENESIN-DM 100-10 MG/5ML PO SYRP: 5.0000 mL | ORAL_SOLUTION | ORAL | Status: DC | PRN

## 2015-07-11 MED ORDER — ACETAMINOPHEN 500 MG PO TABS: 500.0000 mg | ORAL_TABLET | Freq: Four times a day (QID) | ORAL | Status: DC | PRN

## 2015-07-11 MED ORDER — ASPIRIN EC 81 MG PO TBEC
81.0000 mg | DELAYED_RELEASE_TABLET | Freq: Every day | ORAL | Status: DC
  Administered 2015-07-11: 81 mg via ORAL
  Filled 2015-07-11: qty 1

## 2015-07-11 MED ORDER — ALBUTEROL SULFATE (2.5 MG/3ML) 0.083% IN NEBU: 3.0000 mL | INHALATION_SOLUTION | Freq: Four times a day (QID) | RESPIRATORY_TRACT | Status: DC | PRN

## 2015-07-11 MED ORDER — MAGNESIUM OXIDE 400 (241.3 MG) MG PO TABS
400.0000 mg | ORAL_TABLET | Freq: Every day | ORAL | Status: DC
  Administered 2015-07-11: 400 mg via ORAL
  Filled 2015-07-11: qty 1

## 2015-07-11 MED ORDER — ONDANSETRON HCL 4 MG/2ML IJ SOLN: 4.0000 mg | Freq: Four times a day (QID) | INTRAMUSCULAR | Status: DC | PRN

## 2015-07-11 MED ORDER — POLYVINYL ALCOHOL 1.4 % OP SOLN
1.0000 [drp] | Freq: Every day | OPHTHALMIC | Status: DC
  Administered 2015-07-11: 1 [drp] via OPHTHALMIC
  Filled 2015-07-11: qty 15

## 2015-07-11 MED ORDER — POTASSIUM CHLORIDE CRYS ER 20 MEQ PO TBCR
40.0000 meq | EXTENDED_RELEASE_TABLET | Freq: Once | ORAL | Status: AC
  Administered 2015-07-11: 40 meq via ORAL
  Filled 2015-07-11: qty 2

## 2015-07-11 MED ORDER — POLYETHYL GLYCOL-PROPYL GLYCOL 0.4-0.3 % OP GEL
1.0000 "application " | Freq: Every day | OPHTHALMIC | Status: DC
  Filled 2015-07-11: qty 10

## 2015-07-11 MED ORDER — SODIUM CHLORIDE 0.9% FLUSH: 3.0000 mL | Freq: Two times a day (BID) | INTRAVENOUS | Status: DC

## 2015-07-11 MED ORDER — MONTELUKAST SODIUM 10 MG PO TABS: 10.0000 mg | ORAL_TABLET | Freq: Every day | ORAL | Status: DC

## 2015-07-11 MED ORDER — ONDANSETRON HCL 4 MG PO TABS: 4.0000 mg | ORAL_TABLET | Freq: Four times a day (QID) | ORAL | Status: DC | PRN

## 2015-07-11 MED ORDER — DM-GUAIFENESIN ER 30-600 MG PO TB12
1.0000 | ORAL_TABLET | Freq: Two times a day (BID) | ORAL | Status: DC
  Administered 2015-07-11: 1 via ORAL
  Filled 2015-07-11: qty 1

## 2015-07-11 MED ORDER — ENOXAPARIN SODIUM 40 MG/0.4ML ~~LOC~~ SOLN
40.0000 mg | SUBCUTANEOUS | Status: DC
  Administered 2015-07-11: 40 mg via SUBCUTANEOUS
  Filled 2015-07-11: qty 0.4

## 2015-07-11 NOTE — H&P (Signed)
[facility] History and Physical  Elizabeth Branch [id number] DOB: 1946/03/05    PCP:   No primary care provider on file.   Chief Complaint: Syncope  HPI: Elizabeth Branch is an 70 y.o. female  with a hx of a heart murmur and HTN that presents s/p a syncopal episode that occurred around 11pm tonight. Patient was seen in the ED earlier today for a dry cough, chills, and rhinorrhea. Her CXR at that time was unremarkable so she was discharged with a diagnosis of flu-like symptoms. After being discharged, patient went to [facility] and was eating when she began coughing. She then started to gag and had a syncopal episode, lasting approximately 10-30 seconds. Patient states she remembers everything leading up to her syncopal episode and has a hx of them since she was younger. Her last episode was about 7 months ago. She denies any CP, SOB, abdominal pain, n/v/d, weakness, or HA. While in the ED, vital signs were stable. Labs revealed an unremarkable CBC, BMP and negative troponin and lactic acid. Hospitalist was asked to admit her for further management of syncope.   Rewiew of Systems:  Constitutional: Negative for malaise, fever and chills. No significant weight loss or weight gain Eyes: Negative for eye pain, redness and discharge, diplopia, visual changes, or flashes of light. ENMT: Negative for ear pain, hoarseness, nasal congestion, sinus pressure and sore throat. No headaches; tinnitus, drooling, or problem swallowing. Cardiovascular: Negative for chest pain, palpitations, diaphoresis, dyspnea and peripheral edema. ; No orthopnea, PND Respiratory: Positive for cough. Negative for hemoptysis, wheezing and stridor. No pleuritic chestpain. Gastrointestinal: Negative for nausea, vomiting, diarrhea, constipation, abdominal pain, melena, blood in stool, hematemesis, jaundice and rectal bleeding.    Genitourinary: Negative for frequency, dysuria, incontinence,flank pain and  hematuria; Musculoskeletal: Negative for back pain and neck pain. Negative for swelling and trauma.;  Skin: . Negative for pruritus, rash, abrasions, bruising and skin lesion.; ulcerations Neuro: Positive for syncope. Negative for headache, lightheadedness and neck stiffness. Negative for weakness, altered level of consciousness , altered mental status, extremity weakness, burning feet, involuntary movement, seizure.  Psych: negative for anxiety, depression, insomnia, tearfulness, panic attacks, hallucinations, paranoia, suicidal or homicidal ideation     Past Medical History  Diagnosis Date  . Arthritis   . Hypertension   . Seasonal allergies     Past Surgical History  Procedure Laterality Date  . Abdominal hysterectomy    . Tonsillectomy    . Appendectomy    . Cataract extraction      Medications:  HOME MEDS: Prior to Admission medications   Medication Sig Start Date End Date Taking? Authorizing Provider  acetaminophen (TYLENOL) 500 MG tablet Take 500 mg by mouth every 6 (six) hours as needed for mild pain or moderate pain.   Yes Historical Provider, MD  albuterol (PROVENTIL HFA;VENTOLIN HFA) 108 (90 Base) MCG/ACT inhaler Inhale 1-2 puffs into the lungs every 6 (six) hours as needed for wheezing or shortness of breath.   Yes Historical Provider, MD  atenolol (TENORMIN) 25 MG tablet Take 37.5 mg by mouth daily.   Yes Historical Provider, MD  celecoxib (CELEBREX) 100 MG capsule Take 100 mg by mouth daily. *May take one additional capsule as needed for pain if active*   Yes Historical Provider, MD  cephALEXin (KEFLEX) 250 MG capsule Take 250 mg by mouth daily.   Yes Historical Provider, MD  hydrochlorothiazide (HYDRODIURIL) 12.5 MG tablet Take 25 mg by mouth daily.   Yes Historical Provider, MD  loratadine (CLARITIN) 10 MG tablet Take 10 mg by mouth every morning.   Yes Historical Provider, MD  magnesium oxide (MAG-OX) 400 MG tablet Take 400 mg by mouth daily.   Yes Historical  Provider, MD  Misc Natural Products (OSTEO BI-FLEX JOINT SHIELD PO) Take 1 tablet by mouth daily.   Yes Historical Provider, MD  montelukast (SINGULAIR) 10 MG tablet Take 10 mg by mouth at bedtime.   Yes Historical Provider, MD  Omega-3 Fatty Acids (FISH OIL) 1000 MG CAPS Take 1 capsule by mouth daily.   Yes Historical Provider, MD  Polyethyl Glycol-Propyl Glycol (SYSTANE) 0.4-0.3 % GEL ophthalmic gel Place 1 application into both eyes daily.   Yes Historical Provider, MD  Polyethyl Glycol-Propyl Glycol (SYSTANE) 0.4-0.3 % SOLN Apply 1 drop to eye daily.   Yes Historical Provider, MD  oseltamivir (TAMIFLU) 75 MG capsule Take 1 capsule (75 mg total) by mouth every 12 (twelve) hours. 07/10/15   Julianne Rice, MD  predniSONE (DELTASONE) 20 MG tablet Take 2 tablets (40 mg total) by mouth daily. 07/10/15   Julianne Rice, MD     Allergies:  Allergies  Allergen Reactions  . Codeine Nausea And Vomiting  . Naproxen Nausea And Vomiting  . Penicillins     Has patient had a PCN reaction causing immediate rash, facial/tongue/throat swelling, SOB or lightheadedness with hypotension: Yes Has patient had a PCN reaction causing severe rash involving mucus membranes or skin necrosis: No Has patient had a PCN reaction that required hospitalization No Has patient had a PCN reaction occurring within the last 10 years: No If all of the above answers are "NO", then may proceed with Cephalosporin use.     Social History:   reports that she has never smoked. She does not have any smokeless tobacco history on file. She reports that she drinks alcohol. She reports that she does not use illicit drugs.  Family History: History reviewed. No pertinent family history.   Physical Exam: Filed Vitals:   07/10/15 2300 07/10/15 2330 07/10/15 2345 07/11/15 0000  BP: 135/69 148/47  146/49  Pulse: 96 86 89 87  Temp: 99.7 F (37.6 C)     TempSrc: Oral     Resp: 18  23 28   Height: 5\' 2"  (1.575 m)     Weight: 77.111  kg (170 lb)     SpO2: 94% 94% 94% 93%   Blood pressure 146/49, pulse 87, temperature 99.7 F (37.6 C), temperature source Oral, resp. rate 28, height 5\' 2"  (1.575 m), weight 77.111 kg (170 lb), SpO2 93 %.  GEN:  Pleasant. Patient lying in the stretcher in no acute distress; cooperative with exam. PSYCH:  alert and oriented x4; does not appear anxious or depressed; affect is appropriate. HEENT: Mucous membranes pink and anicteric; PERRLA; EOM intact; no cervical lymphadenopathy nor thyromegaly or carotid bruit; 6cm JVD; There were no stridor. Neck is very supple. Breasts:: Not examined CHEST WALL: No tenderness CHEST: Bilateral crackles, no wheezing  HEART: Regular rate and rhythm.  There is a 2/6 SEM at the LSB, cw either aortic slerosis or stenosis, non critical by exam.   BACK: No kyphosis or scoliosis; no CVA tenderness ABDOMEN: soft and non-tender; no masses, no organomegaly, normal abdominal bowel sounds; no pannus; no intertriginous candida. There is no rebound and no distention. Rectal Exam: Not done EXTREMITIES: No bone or joint deformity; age-appropriate arthropathy of the hands and knees; no edema; no ulcerations.  There is no calf tenderness. Genitalia: not examined PULSES: 2+  and symmetric SKIN: Normal hydration no rash or ulceration CNS: Cranial nerves 2-12 grossly intact no focal lateralizing neurologic deficit.  Speech is fluent; uvula elevated with phonation, facial symmetry and tongue midline. DTR are normal bilaterally, cerebella exam is intact, barbinski is negative and strengths are equaled bilaterally.  No sensory loss.   Labs on Admission:  Basic Metabolic Panel:  Recent Labs Lab 07/10/15 2359  NA 137  K 3.3*  CL 102  CO2 27  GLUCOSE 139*  BUN 18  CREATININE 0.89  CALCIUM 9.0   Liver Function Tests:  Recent Labs Lab 07/10/15 2359  AST 28  ALT 17  ALKPHOS 87  BILITOT 1.1  PROT 7.4  ALBUMIN 4.1   CBC:  Recent Labs Lab 07/10/15 2359  WBC 6.3   NEUTROABS 5.5  HGB 12.0  HCT 36.5  MCV 88.6  PLT 191    Radiological Exams on Admission: Dg Chest 2 View  07/11/2015  CLINICAL DATA:  Syncopal episode prior to arrival. Seen earlier today for cough. History of hypertension. EXAM: CHEST  2 VIEW COMPARISON:  Chest radiograph July 10, 2015 at 1835 hours FINDINGS: Cardiomediastinal silhouette is normal. The lungs are clear without pleural effusions or focal consolidations. Trachea projects midline and there is no pneumothorax. Soft tissue planes and included osseous structures are non-suspicious. IMPRESSION: No acute cardiopulmonary process. Electronically Signed   By: Elon Alas M.D.   On: 07/11/2015 00:05   Dg Chest 2 View  07/10/2015  CLINICAL DATA:  70 year old female with cough. EXAM: CHEST  2 VIEW COMPARISON:  None. FINDINGS: Two views of the chest demonstrate emphysematous changes of the lungs. There are linear and platelike atelectasis predominantly involving the lower lung fields. No focal consolidation, pleural effusion, or pneumothorax. The cardiac silhouette is within normal limits. No acute osseous pathology. IMPRESSION: No active cardiopulmonary disease. Electronically Signed   By: Anner Crete M.D.   On: 07/10/2015 19:12    EKG: Independently reviewed.    Assessment/Plan Syncope, suspect cough syncope syndrome. Heart murmur HTN   PLAN:   Patient is being admitted for syncope, likely a cough syncope.  I don't think she had a seizure as her clinical history is suspicious for cough syncope.   Given her hx of a heart murmur, though benign by physical exam, we will order an ECHO for the am.  She will be r/out and placed on telemetry.  Will give ASA and continue her Lopressor.  Patient has a cough however has no evidence of infection on CXR and is afebrile with a normal WBC. We will give her a cough suppressant but will not initiate abx at this time.   She is otherwise stable, full code, and will be admitted to [facility]  service.     Other plans as per orders. Code Status: Full    Orvan Falconer. MD FACP [facility] Pager [phone] 7pm to 7am.  07/11/2015, 1:52 AM  By signing my name below, I, Rosalie Doctor, attest that this documentation has been prepared under the direction and in the presence of Orvan Falconer. MD Electronically Signed: Rosalie Doctor, Scribe. 07/11/2015

## 2015-07-11 NOTE — Progress Notes (Signed)
Patient states understanding of discharge instructions.  

## 2015-07-11 NOTE — Discharge Summary (Signed)
Physician Discharge Summary  Elizabeth Branch [id number] DOB: [birth year] DOA: 07/10/2015  PCP: No primary care provider on file.  Admit date: 07/10/2015 Discharge date: 07/11/2015  Time spent: 35 minutes  Recommendations for Outpatient Follow-up:  1. Patient placed in overnight observation for syncope likely secondary to cough  Discharge Diagnoses:  Principal Problem:   Cough syncope Active Problems:   Heart murmur   Mitral valve prolapse   HTN (hypertension)   Cough syncope syndrome   Discharge Condition: Stable/improved  Diet recommendation: Heart healthy diet  Filed Weights   07/10/15 2300  Weight: 77.111 kg (170 lb)    History of present illness:  Elizabeth Branch is an 70 y.o. female with a hx of a heart murmur and HTN that presents s/p a syncopal episode that occurred around 11pm tonight. Patient was seen in the ED earlier today for a dry cough, chills, and rhinorrhea. Her CXR at that time was unremarkable so she was discharged with a diagnosis of flu-like symptoms. After being discharged, patient went to [facility] and was eating when she began coughing. She then started to gag and had a syncopal episode, lasting approximately 10-30 seconds. Patient states she remembers everything leading up to her syncopal episode and has a hx of them since she was younger. Her last episode was about 7 months ago. She denies any CP, SOB, abdominal pain, n/v/d, weakness, or HA. While in the ED, vital signs were stable. Labs revealed an unremarkable CBC, BMP and negative troponin and lactic acid. Hospitalist was asked to admit her for further management of syncope.   Hospital Course:  Elizabeth Branch is a pleasant 69 year old female with a history of hypertension, presented overnight to the emergency department after having a syncopal event. She was evaluated earlier in the emergency department for cough, chills, rhinorrhea felt the symptoms were secondary to viral infection. He was discharged  from the emergency department on Tamiflu and prednisone. She reported having an episode of coughing and her car as her husband was driving when she passed out. Symptoms suspect to be secondary to cough syncope. Workup included troponin level which came back negative, EKG that showed normal sinus rhythm, white count within normal limits. She was monitored overnight, she did not have further episodes of syncope. Repeat troponin coming back negative. She was instructed to resume Tamiflu and prednisone prescribed yesterday were by emergency room physician. She was discharged in stable condition on 07/11/2015.   Discharge Exam: Filed Vitals:   07/11/15 0300 07/11/15 0421  BP: 142/52 145/44  Pulse: 81 76  Temp:  100.6 F (38.1 C)  Resp: 25 22    General: No acute distress, awake and alert, nontoxic appearing Cardiovascular: Regular rate rhythm normal S1-S2 Respiratory: Normal respiratory effort lungs are clear to auscultation bilaterally Abdomen: Soft nontender nondistended  Discharge Instructions   Discharge Instructions    Call MD for:  difficulty breathing, headache or visual disturbances    Complete by:  As directed      Call MD for:  extreme fatigue    Complete by:  As directed      Call MD for:  hives    Complete by:  As directed      Call MD for:  persistant dizziness or light-headedness    Complete by:  As directed      Call MD for:  persistant nausea and vomiting    Complete by:  As directed      Call MD for:  redness, tenderness, or signs  of infection (pain, swelling, redness, odor or green/yellow discharge around incision site)    Complete by:  As directed      Call MD for:  severe uncontrolled pain    Complete by:  As directed      Call MD for:  temperature >100.4    Complete by:  As directed      Call MD for:    Complete by:  As directed      Diet - low sodium heart healthy    Complete by:  As directed      Increase activity slowly    Complete by:  As directed            Current Discharge Medication List    START taking these medications   Details  guaiFENesin-dextromethorphan (ROBITUSSIN DM) 100-10 MG/5ML syrup Take 5 mLs by mouth every 4 (four) hours as needed for cough. Qty: 118 mL, Refills: 0      CONTINUE these medications which have NOT CHANGED   Details  acetaminophen (TYLENOL) 500 MG tablet Take 500 mg by mouth every 6 (six) hours as needed for mild pain or moderate pain.    albuterol (PROVENTIL HFA;VENTOLIN HFA) 108 (90 Base) MCG/ACT inhaler Inhale 1-2 puffs into the lungs every 6 (six) hours as needed for wheezing or shortness of breath.    atenolol (TENORMIN) 25 MG tablet Take 37.5 mg by mouth daily.    celecoxib (CELEBREX) 100 MG capsule Take 100 mg by mouth daily. *May take one additional capsule as needed for pain if active*    hydrochlorothiazide (HYDRODIURIL) 12.5 MG tablet Take 25 mg by mouth daily.    loratadine (CLARITIN) 10 MG tablet Take 10 mg by mouth every morning.    magnesium oxide (MAG-OX) 400 MG tablet Take 400 mg by mouth daily.    Misc Natural Products (OSTEO BI-FLEX JOINT SHIELD PO) Take 1 tablet by mouth daily.    montelukast (SINGULAIR) 10 MG tablet Take 10 mg by mouth at bedtime.    Omega-3 Fatty Acids (FISH OIL) 1000 MG CAPS Take 1 capsule by mouth daily.    Polyethyl Glycol-Propyl Glycol (SYSTANE) 0.4-0.3 % SOLN Apply 1 drop to eye daily.      STOP taking these medications     cephALEXin (KEFLEX) 250 MG capsule      Polyethyl Glycol-Propyl Glycol (SYSTANE) 0.4-0.3 % GEL ophthalmic gel      oseltamivir (TAMIFLU) 75 MG capsule      predniSONE (DELTASONE) 20 MG tablet        Allergies  Allergen Reactions  . Codeine Nausea And Vomiting  . Naproxen Nausea And Vomiting  . Penicillins     Has patient had a PCN reaction causing immediate rash, facial/tongue/throat swelling, SOB or lightheadedness with hypotension: Yes Has patient had a PCN reaction causing severe rash involving mucus membranes or  skin necrosis: No Has patient had a PCN reaction that required hospitalization No Has patient had a PCN reaction occurring within the last 10 years: No If all of the above answers are "NO", then may proceed with Cephalosporin use.       The results of significant diagnostics from this hospitalization (including imaging, microbiology, ancillary and laboratory) are listed below for reference.    Significant Diagnostic Studies: Dg Chest 2 View  07/11/2015  CLINICAL DATA:  Syncopal episode prior to arrival. Seen earlier today for cough. History of hypertension. EXAM: CHEST  2 VIEW COMPARISON:  Chest radiograph July 10, 2015 at 1835 hours FINDINGS: Cardiomediastinal  silhouette is normal. The lungs are clear without pleural effusions or focal consolidations. Trachea projects midline and there is no pneumothorax. Soft tissue planes and included osseous structures are non-suspicious. IMPRESSION: No acute cardiopulmonary process. Electronically Signed   By: Elon Alas M.D.   On: 07/11/2015 00:05   Dg Chest 2 View  07/10/2015  CLINICAL DATA:  70 year old female with cough. EXAM: CHEST  2 VIEW COMPARISON:  None. FINDINGS: Two views of the chest demonstrate emphysematous changes of the lungs. There are linear and platelike atelectasis predominantly involving the lower lung fields. No focal consolidation, pleural effusion, or pneumothorax. The cardiac silhouette is within normal limits. No acute osseous pathology. IMPRESSION: No active cardiopulmonary disease. Electronically Signed   By: Anner Crete M.D.   On: 07/10/2015 19:12    Microbiology: No results found for this or any previous visit (from the past 240 hour(s)).   Labs: Basic Metabolic Panel:  Recent Labs Lab 07/10/15 2359 07/11/15 0547  NA 137  --   K 3.3*  --   CL 102  --   CO2 27  --   GLUCOSE 139*  --   BUN 18  --   CREATININE 0.89 0.77  CALCIUM 9.0  --    Liver Function Tests:  Recent Labs Lab 07/10/15 2359  AST  28  ALT 17  ALKPHOS 87  BILITOT 1.1  PROT 7.4  ALBUMIN 4.1   No results for input(s): LIPASE, AMYLASE in the last 168 hours. No results for input(s): AMMONIA in the last 168 hours. CBC:  Recent Labs Lab 07/10/15 2359 07/11/15 0547  WBC 6.3 5.2  NEUTROABS 5.5  --   HGB 12.0 11.2*  HCT 36.5 34.5*  MCV 88.6 88.2  PLT 191 200   Cardiac Enzymes:  Recent Labs Lab 07/11/15 0547  TROPONINI <0.03   BNP: BNP (last 3 results) No results for input(s): BNP in the last 8760 hours.  ProBNP (last 3 results) No results for input(s): PROBNP in the last 8760 hours.  CBG: No results for input(s): GLUCAP in the last 168 hours.     Signed:  Kelvin Cellar MD.  [facility] 07/11/2015, 10:51 AM

## 2015-07-29 ENCOUNTER — Encounter: Payer: Self-pay | Admitting: Gastroenterology

## 2015-09-12 ENCOUNTER — Ambulatory Visit: Payer: Medicare HMO

## 2015-09-12 VITALS — Ht 62.0 in | Wt 171.6 lb

## 2015-09-12 DIAGNOSIS — Z1211 Encounter for screening for malignant neoplasm of colon: Secondary | ICD-10-CM

## 2015-09-12 NOTE — Progress Notes (Signed)
Has egg and soy ALLERGY No home oxygen No diet/weight loss meds No past problems with anesthesia  No internet

## 2015-09-22 ENCOUNTER — Telehealth: Payer: Self-pay | Admitting: Gastroenterology

## 2015-09-22 NOTE — Telephone Encounter (Signed)
Free sample Suprep (pt said she can not afford) and new instructions given to [facility] front desk 4th floor.  Pt plans to pick it up tomorrow between 8-5.  Ja Pistole/PV

## 2015-09-22 NOTE — Telephone Encounter (Signed)
Tried to return call X 3.  No answer X 2 and busy signal X 1.                                                                                   Angela/PV

## 2015-09-22 NOTE — Telephone Encounter (Signed)
suprep sample and instructions left at 4th floor desk

## 2015-09-26 ENCOUNTER — Encounter: Payer: Self-pay | Admitting: Gastroenterology

## 2015-09-26 ENCOUNTER — Ambulatory Visit (AMBULATORY_SURGERY_CENTER): Payer: Medicare HMO | Admitting: Gastroenterology

## 2015-09-26 VITALS — BP 144/70 | HR 59 | Temp 99.1°F | Resp 12 | Ht 62.0 in | Wt 171.0 lb

## 2015-09-26 DIAGNOSIS — Z1211 Encounter for screening for malignant neoplasm of colon: Secondary | ICD-10-CM | POA: Diagnosis not present

## 2015-09-26 DIAGNOSIS — D123 Benign neoplasm of transverse colon: Secondary | ICD-10-CM | POA: Diagnosis not present

## 2015-09-26 MED ORDER — SODIUM CHLORIDE 0.9 % IV SOLN: 500.0000 mL | INTRAVENOUS | Status: DC

## 2015-09-26 NOTE — Op Note (Signed)
[place] Patient Name: Elizabeth Branch Procedure Date: 09/26/2015 10:28 AM MRN: [id number] Endoscopist: Mallie Mussel L. Loletha Carrow , MD Age: 70 Date of Birth: [birth year] Gender: Female Procedure:                Colonoscopy Indications:              Screening for colorectal malignant neoplasm Medicines:                Monitored Anesthesia Care Procedure:                Pre-Anesthesia Assessment:                           - Prior to the procedure, a History and Physical                            was performed, and patient medications and                            allergies were reviewed. The patient's tolerance of                            previous anesthesia was also reviewed. The risks                            and benefits of the procedure and the sedation                            options and risks were discussed with the patient.                            All questions were answered, and informed consent                            was obtained. Prior Anticoagulants: The patient has                            taken no previous anticoagulant or antiplatelet                            agents. ASA Grade Assessment: II - A patient with                            mild systemic disease. After reviewing the risks                            and benefits, the patient was deemed in                            satisfactory condition to undergo the procedure.                           After obtaining informed consent, the colonoscope  was passed under direct vision. Throughout the                            procedure, the patient's blood pressure, pulse, and                            oxygen saturations were monitored continuously. The                            Model CF-HQ190L [id number]) scope was introduced                            through the anus and advanced to the the cecum,                            identified by appendiceal orifice and ileocecal                         valve. The colonoscopy was performed without                            difficulty. The patient tolerated the procedure                            well. The quality of the bowel preparation was                            good. The ileocecal valve, appendiceal orifice, and                            rectum were photographed. Scope In: 10:40:27 AM Scope Out: 10:52:54 AM Scope Withdrawal Time: 0 hours 9 minutes 41 seconds  Total Procedure Duration: 0 hours 12 minutes 27 seconds  Findings:                 The perianal and digital rectal examinations were                            normal.                           Multiple medium-mouthed diverticula were found from                            sigmoid to ascending colon.                           A 4 mm polyp was found in the hepatic flexure. The                            polyp was sessile. The polyp was removed with a                            cold snare. Resection was complete, but the polyp  tissue was not retrieved.                           Retroflexion in the rectum was not performed due to                            anatomy.                           The exam was otherwise without abnormality. Complications:            No immediate complications. Estimated Blood Loss:     Estimated blood loss: none. Impression:               - Diverticulosis from sigmoid to ascending colon.                           - One 4 mm polyp at the hepatic flexure, removed                            with a cold snare. Complete resection. Polyp tissue                            not retrieved.                           - The examination was otherwise normal. Recommendation:           - Patient has a contact number available for                            emergencies. The signs and symptoms of potential                            delayed complications were discussed with the                            patient.  Return to normal activities tomorrow.                            Written discharge instructions were provided to the                            patient.                           - Resume previous diet.                           - Continue present medications.                           - No repeat colonoscopy due to age. Addley Ballinger L. Loletha Carrow, MD 09/26/2015 11:02:04 AM This report has been signed electronically.

## 2015-09-26 NOTE — Progress Notes (Signed)
A/ox3 pleased with MAC, report to Jane RN 

## 2015-09-26 NOTE — Progress Notes (Signed)
Called to room to assist during endoscopic procedure.  Patient ID and intended procedure confirmed with present staff. Received instructions for my participation in the procedure from the performing physician.  

## 2015-09-26 NOTE — Patient Instructions (Signed)

## 2015-09-27 ENCOUNTER — Telehealth: Payer: Self-pay | Admitting: *Deleted

## 2015-09-27 NOTE — Telephone Encounter (Signed)
  Follow up Call-  Call back number 09/26/2015  Post procedure Call Back phone  # [phone]  Permission to leave phone message No     Patient questions:  Do you have a fever, pain , or abdominal swelling? No. Pain Score  0 *  Have you tolerated food without any problems? Yes.    Have you been able to return to your normal activities? Yes.    Do you have any questions about your discharge instructions: Diet   No. Medications  No. Follow up visit  No.  Do you have questions or concerns about your Care? No.  Actions: * If pain score is 4 or above: No action needed, pain <4.

## 2019-07-14 ENCOUNTER — Other Ambulatory Visit: Payer: Self-pay | Admitting: Obstetrics and Gynecology

## 2019-07-14 DIAGNOSIS — Q891 Congenital malformations of adrenal gland: Secondary | ICD-10-CM

## 2019-07-15 ENCOUNTER — Other Ambulatory Visit: Payer: Self-pay | Admitting: Obstetrics and Gynecology

## 2019-07-15 DIAGNOSIS — Z8679 Personal history of other diseases of the circulatory system: Secondary | ICD-10-CM

## 2019-07-23 ENCOUNTER — Ambulatory Visit: Payer: Medicare Other

## 2019-08-06 ENCOUNTER — Other Ambulatory Visit: Payer: Self-pay

## 2019-08-06 ENCOUNTER — Ambulatory Visit
Admission: RE | Admit: 2019-08-06 | Discharge: 2019-08-06 | Disposition: A | Discharge status: Home / Self Care | Payer: Medicare Other | Source: Ambulatory Visit | Attending: Obstetrics and Gynecology | Admitting: Obstetrics and Gynecology

## 2019-08-06 ENCOUNTER — Other Ambulatory Visit: Payer: Self-pay | Admitting: Obstetrics and Gynecology

## 2019-08-06 DIAGNOSIS — Q891 Congenital malformations of adrenal gland: Secondary | ICD-10-CM

## 2019-11-17 ENCOUNTER — Other Ambulatory Visit: Payer: Self-pay | Admitting: Obstetrics and Gynecology

## 2019-11-17 DIAGNOSIS — R42 Dizziness and giddiness: Secondary | ICD-10-CM

## 2019-12-17 ENCOUNTER — Other Ambulatory Visit: Payer: Self-pay

## 2019-12-17 ENCOUNTER — Ambulatory Visit
Admission: RE | Admit: 2019-12-17 | Discharge: 2019-12-17 | Disposition: A | Discharge status: Home / Self Care | Payer: Medicare Other | Source: Ambulatory Visit | Attending: Obstetrics and Gynecology | Admitting: Obstetrics and Gynecology

## 2019-12-17 DIAGNOSIS — Z8679 Personal history of other diseases of the circulatory system: Secondary | ICD-10-CM | POA: Diagnosis not present

## 2019-12-17 DIAGNOSIS — I083 Combined rheumatic disorders of mitral, aortic and tricuspid valves: Secondary | ICD-10-CM | POA: Insufficient documentation

## 2019-12-17 DIAGNOSIS — J45909 Unspecified asthma, uncomplicated: Secondary | ICD-10-CM | POA: Diagnosis not present

## 2019-12-17 DIAGNOSIS — I10 Essential (primary) hypertension: Secondary | ICD-10-CM | POA: Diagnosis not present

## 2019-12-17 LAB — ECHOCARDIOGRAM COMPLETE
AR max vel: 1.64 cm2
AV Area VTI: 1.71 cm2
AV Area mean vel: 1.78 cm2
AV Mean grad: 4 mmHg
AV Peak grad: 7.8 mmHg
Ao pk vel: 1.4 m/s
Area-P 1/2: 3.6 cm2
P 1/2 time: 979 msec
S' Lateral: 3.38 cm

## 2019-12-17 NOTE — Progress Notes (Signed)
*  PRELIMINARY RESULTS* Echocardiogram 2D Echocardiogram has been performed.  Elizabeth Branch 12/17/2019, 10:12 AM

## 2020-10-20 ENCOUNTER — Ambulatory Visit (INDEPENDENT_AMBULATORY_CARE_PROVIDER_SITE_OTHER): Payer: Medicare HMO | Admitting: Gastroenterology

## 2020-10-20 ENCOUNTER — Encounter: Payer: Self-pay | Admitting: Gastroenterology

## 2020-10-20 VITALS — BP 150/60 | HR 76 | Ht 62.0 in | Wt 165.2 lb

## 2020-10-20 DIAGNOSIS — Z86010 Personal history of colonic polyps: Secondary | ICD-10-CM | POA: Diagnosis not present

## 2020-10-20 DIAGNOSIS — Z800 Family history of malignant neoplasm of digestive organs: Secondary | ICD-10-CM

## 2020-10-20 DIAGNOSIS — K5900 Constipation, unspecified: Secondary | ICD-10-CM

## 2020-10-20 NOTE — Patient Instructions (Signed)
If you are age 77 or older, your body mass index should be between 23-30. Your Body mass index is 30.22 kg/m. If this is out of the aforementioned range listed, please consider follow up with your Primary Care Provider.  If you are age 19 or younger, your body mass index should be between 19-25. Your Body mass index is 30.22 kg/m. If this is out of the aformentioned range listed, please consider follow up with your Primary Care Provider.   You have been scheduled for a colonoscopy. Please follow written instructions given to you at your visit today.  Please pick up your prep supplies at the pharmacy within the next 1-3 days. If you use inhalers (even only as needed), please bring them with you on the day of your procedure.  Due to recent changes in healthcare laws, you may see the results of your imaging and laboratory studies on MyChart before your provider has had a chance to review them.  We understand that in some cases there may be results that are confusing or concerning to you. Not all laboratory results come back in the same time frame and the provider may be waiting for multiple results in order to interpret others.  Please give [place] 48 hours in order for your provider to thoroughly review all the results before contacting the office for clarification of your results.   It was a pleasure to see you today!  Thank you for trusting me with your gastrointestinal care!

## 2020-10-20 NOTE — Progress Notes (Signed)
[facility] Consult Note:  History: Elizabeth Branch 10/20/2020  Referring provider: Josephine Cables, MD  Reason for consult/chief complaint: Colonoscopy (Last Colonoscopy 2017, she did have polyps at that time.)   Subjective  HPI: I last saw Elizabeth Branch for a colonoscopy in April 2017, at which time she was 75 years old.  It was a complete exam to the cecum with good preparation, and a diminutive hepatic flexure polyp was removed.  Polyp tissue was not retrieved, recommendation was for no further surveillance colonoscopy due to age and low risk findings.  [name] want to talk to me about having a colonoscopy because of her previous polyp.  She has occasional constipation and denies rectal bleeding.  Denies dysphagia odynophagia and nausea vomiting or weight loss. She was concerned because a cousin was diagnosed with colon cancer in their 97s.  ROS:  Review of Systems She denies chest pain dyspnea or dysuria. Chronic arthritic knee pain  Past Medical History: Past Medical History:  Diagnosis Date  . Allergy   . Arthritis   . Asthma   . Blood transfusion without reported diagnosis   . Cataract   . Heart murmur   . Hypertension   . Seasonal allergies      Past Surgical History: Past Surgical History:  Procedure Laterality Date  . ABDOMINAL HYSTERECTOMY    . APPENDECTOMY    . CATARACT EXTRACTION    . TONSILLECTOMY       Family History: Family History  Problem Relation Age of Onset  . Colon cancer Cousin   . Breast cancer Maternal Aunt     Social History: Social History   Socioeconomic History  . Marital status: Widowed    Spouse name: Not on file  . Number of children: Not on file  . Years of education: Not on file  . Highest education level: Not on file  Occupational History  . Not on file  Tobacco Use  . Smoking status: Never Smoker  . Smokeless tobacco: Never Used  Substance and Sexual Activity  . Alcohol use: Yes    Comment:  occassional  . Drug use: No  . Sexual activity: Not on file  Other Topics Concern  . Not on file  Social History Narrative  . Not on file   Social Determinants of Health   Financial Resource Strain: Not on file  Food Insecurity: Not on file  Transportation Needs: Not on file  Physical Activity: Not on file  Stress: Not on file  Social Connections: Not on file    Allergies: Allergies  Allergen Reactions  . Codeine Nausea And Vomiting  . Naproxen Nausea And Vomiting  . Penicillins     Has patient had a PCN reaction causing immediate rash, facial/tongue/throat swelling, SOB or lightheadedness with hypotension: Yes Has patient had a PCN reaction causing severe rash involving mucus membranes or skin necrosis: No Has patient had a PCN reaction that required hospitalization No Has patient had a PCN reaction occurring within the last 10 years: No If all of the above answers are "NO", then may proceed with Cephalosporin use.   . Eggs Or Egg-Derived Products Swelling and Rash  . Pineapple Rash    Outpatient Meds: Current Outpatient Medications  Medication Sig Dispense Refill  . acetaminophen (TYLENOL) 325 MG tablet Take 325 mg by mouth every 6 (six) hours as needed for mild pain or moderate pain.    Marland Kitchen albuterol (PROVENTIL HFA;VENTOLIN HFA) 108 (90 Base) MCG/ACT inhaler Inhale 1-2 puffs into the  lungs every 6 (six) hours as needed for wheezing or shortness of breath.    . celecoxib (CELEBREX) 100 MG capsule Take 100 mg by mouth daily. *May take one additional capsule as needed for pain if active*    . chlorthalidone (HYGROTON) 25 MG tablet Take 1 tablet by mouth daily.    . Cyanocobalamin 1000 MCG CAPS Take by mouth.    . magnesium oxide (MAG-OX) 400 MG tablet Take 400 mg by mouth daily.    . Misc Natural Products (OSTEO BI-FLEX JOINT SHIELD PO) Take 1 tablet by mouth daily.    . montelukast (SINGULAIR) 10 MG tablet Take 10 mg by mouth at bedtime.    . multivitamin-lutein  (OCUVITE-LUTEIN) CAPS capsule Take 1 capsule by mouth daily.    . Omega-3 Fatty Acids (FISH OIL) 1000 MG CAPS Take 1 capsule by mouth daily.    Vladimir Faster Glycol-Propyl Glycol 0.4-0.3 % SOLN Apply 1 drop to eye daily.    Marland Kitchen zinc gluconate 50 MG tablet Take 50 mg by mouth daily.     No current facility-administered medications for this visit.      ___________________________________________________________________ Objective   Exam:  BP (!) 150/60   Pulse 76   Ht 5\' 2"  (1.575 m)   Wt 165 lb 3.2 oz (74.9 kg)   BMI 30.22 kg/m  Wt Readings from Last 3 Encounters:  10/20/20 165 lb 3.2 oz (74.9 kg)  09/26/15 171 lb (77.6 kg)  09/12/15 171 lb 9.6 oz (77.8 kg)     General: Well-appearing, antalgic gait.  Gets on exam table with assistance  Eyes: sclera anicteric, no redness  ENT: oral mucosa moist without lesions, no cervical or supraclavicular lymphadenopathy  CV: RRR without murmur, S1/S2, no JVD, no peripheral edema  Resp: clear to auscultation bilaterally, normal RR and effort noted  GI: soft, no tenderness, with active bowel sounds. No guarding or palpable organomegaly noted.  Skin; warm and dry, no rash or jaundice noted  Neuro: awake, alert and oriented x 3. Normal gross motor function and fluent speech   Radiologic Studies:  July 2021 echocardiogram   1. Left ventricular ejection fraction, by estimation, is 55 to 60%. The  left ventricle has normal function. The left ventricle has no regional  wall motion abnormalities. Left ventricular diastolic parameters are  consistent with Grade I diastolic  dysfunction (impaired relaxation).   2. Right ventricular systolic function is normal. The right ventricular  size is normal. Tricuspid regurgitation signal is inadequate for assessing  PA pressure.   3. The mitral valve is normal in structure. Unable to exclude mild  prolapse, both anterior and posterior leaflets. Mild to moderate mitral  valve regurgitation.   4.  The aortic valve is grossly normal, though not well visualized. Aortic  valve regurgitation is mild.    Assessment: Encounter Diagnosis  Name Primary?  . History of colonic polyps Yes    Polyp of unknown pathology 5 years ago (tissue not retrieved).  Most recent screening and surveillance guidelines recommend more conservative approach to surveillance colonoscopies in patients over 75.  Factors to consider include patient's age, overall health, previous polyp history, family history and patient's concern and desire to have the procedure performed. Current guidelines say it is a reasonable option and this scenario.  She wished to pursue a colonoscopy, and I am agreeable to performing it.  Procedure was described along with risks and benefits.  The benefits and risks of the planned procedure were described in detail with the  patient or (when appropriate) their health care proxy.  Risks were outlined as including, but not limited to, bleeding, infection, perforation, adverse medication reaction leading to cardiac or pulmonary decompensation, pancreatitis (if ERCP).  The limitation of incomplete mucosal visualization was also discussed.  No guarantees or warranties were given. Unless there are high risk findings on this procedure, expected will be her last routine colonoscopy  Thank you for the courtesy of this consult.  Please call me with any questions or concerns.  Nelida Meuse III  CC: Referring provider noted above

## 2021-01-31 ENCOUNTER — Ambulatory Visit (AMBULATORY_SURGERY_CENTER): Payer: Medicare HMO | Admitting: Gastroenterology

## 2021-01-31 ENCOUNTER — Encounter: Payer: Self-pay | Admitting: Gastroenterology

## 2021-01-31 ENCOUNTER — Other Ambulatory Visit: Payer: Self-pay

## 2021-01-31 VITALS — BP 127/50 | HR 55 | Temp 97.1°F | Resp 17 | Ht 62.0 in | Wt 165.0 lb

## 2021-01-31 DIAGNOSIS — D120 Benign neoplasm of cecum: Secondary | ICD-10-CM | POA: Diagnosis not present

## 2021-01-31 DIAGNOSIS — Z86010 Personal history of colonic polyps: Secondary | ICD-10-CM | POA: Diagnosis not present

## 2021-01-31 DIAGNOSIS — D124 Benign neoplasm of descending colon: Secondary | ICD-10-CM

## 2021-01-31 DIAGNOSIS — D122 Benign neoplasm of ascending colon: Secondary | ICD-10-CM

## 2021-01-31 MED ORDER — SODIUM CHLORIDE 0.9 % IV SOLN
500.0000 mL | Freq: Once | INTRAVENOUS | Status: DC
Start: 2021-01-31 — End: 2021-01-31

## 2021-01-31 NOTE — Progress Notes (Signed)
Called to room to assist during endoscopic procedure.  Patient ID and intended procedure confirmed with present staff. Received instructions for my participation in the procedure from the performing physician.  

## 2021-01-31 NOTE — Progress Notes (Signed)
History and Physical:  This patient presents for endoscopic testing for: Encounter Diagnosis  Name Primary?   History of colonic polyps Yes   See 10/20/20 office not for details.  No clinical changes since then.    Past Medical History: Past Medical History:  Diagnosis Date   Allergy    Arthritis    Asthma    Blood transfusion without reported diagnosis    Cataract    Heart murmur    Hypertension    Seasonal allergies      Past Surgical History: Past Surgical History:  Procedure Laterality Date   ABDOMINAL HYSTERECTOMY     APPENDECTOMY     CATARACT EXTRACTION     TONSILLECTOMY      Allergies: Allergies  Allergen Reactions   Codeine Nausea And Vomiting   Naproxen Nausea And Vomiting   Penicillins     Has patient had a PCN reaction causing immediate rash, facial/tongue/throat swelling, SOB or lightheadedness with hypotension: Yes Has patient had a PCN reaction causing severe rash involving mucus membranes or skin necrosis: No Has patient had a PCN reaction that required hospitalization No Has patient had a PCN reaction occurring within the last 10 years: No If all of the above answers are "NO", then may proceed with Cephalosporin use.    Eggs Or Egg-Derived Products Swelling and Rash   Pineapple Rash    Outpatient Meds: Current Outpatient Medications  Medication Sig Dispense Refill   albuterol (PROVENTIL HFA;VENTOLIN HFA) 108 (90 Base) MCG/ACT inhaler Inhale 1-2 puffs into the lungs every 6 (six) hours as needed for wheezing or shortness of breath.     atenolol (TENORMIN) 25 MG tablet Take 25 mg by mouth daily.     benzonatate (TESSALON) 100 MG capsule Take by mouth.     celecoxib (CELEBREX) 100 MG capsule Take 100 mg by mouth daily. *May take one additional capsule as needed for pain if active*     chlorthalidone (HYGROTON) 25 MG tablet Take 1 tablet by mouth daily.     Cyanocobalamin 1000 MCG CAPS Take by mouth.     guaiFENesin-codeine 100-10 MG/5ML syrup Take  5 mLs by mouth every 4 (four) hours as needed.     hydrochlorothiazide (MICROZIDE) 12.5 MG capsule Take 12.5 mg by mouth daily.     magnesium oxide (MAG-OX) 400 MG tablet Take 400 mg by mouth daily.     Misc Natural Products (OSTEO BI-FLEX JOINT SHIELD PO) Take 1 tablet by mouth daily.     montelukast (SINGULAIR) 10 MG tablet Take 10 mg by mouth at bedtime.     multivitamin-lutein (OCUVITE-LUTEIN) CAPS capsule Take 1 capsule by mouth daily.     Omega-3 Fatty Acids (FISH OIL) 1000 MG CAPS Take 1 capsule by mouth daily.     Polyethyl Glycol-Propyl Glycol 0.4-0.3 % SOLN Apply 1 drop to eye daily.     zinc gluconate 50 MG tablet Take 50 mg by mouth daily.     acetaminophen (TYLENOL) 325 MG tablet Take 325 mg by mouth every 6 (six) hours as needed for mild pain or moderate pain.     Current Facility-Administered Medications  Medication Dose Route Frequency Provider Last Rate Last Admin   0.9 %  sodium chloride infusion  500 mL Intravenous Once Danis, Estill Cotta III, MD          ___________________________________________________________________ Objective   Exam:  BP (!) 167/68   Pulse 64   Temp (!) 97.1 F (36.2 C) (Skin)   Ht '5\' 2"'$  (  1.575 m)   Wt 165 lb (74.8 kg)   SpO2 98%   BMI 30.18 kg/m   CV: RRR without murmur, S1/S2 Resp: clear to auscultation bilaterally, normal RR and effort noted GI: soft, no tenderness, with active bowel sounds.   Assessment: Encounter Diagnosis  Name Primary?   History of colonic polyps Yes     Plan: Colonoscopy   The patient is appropriate for an endoscopic procedure in the ambulatory setting.   - Wilfrid Lund, MD

## 2021-01-31 NOTE — Patient Instructions (Signed)
YOU HAD AN ENDOSCOPIC PROCEDURE TODAY AT [facility]:   Refer to the procedure report that was given to you for any specific questions about what was found during the examination.  If the procedure report does not answer your questions, please call your gastroenterologist to clarify.  If you requested that your care partner not be given the details of your procedure findings, then the procedure report has been included in a sealed envelope for you to review at your convenience later.  YOU SHOULD EXPECT: Some feelings of bloating in the abdomen. Passage of more gas than usual.  Walking can help get rid of the air that was put into your GI tract during the procedure and reduce the bloating. If you had a lower endoscopy (such as a colonoscopy or flexible sigmoidoscopy) you may notice spotting of blood in your stool or on the toilet paper. If you underwent a bowel prep for your procedure, you may not have a normal bowel movement for a few days.  Please Note:  You might notice some irritation and congestion in your nose or some drainage.  This is from the oxygen used during your procedure.  There is no need for concern and it should clear up in a day or so.  SYMPTOMS TO REPORT IMMEDIATELY:  Following lower endoscopy (colonoscopy or flexible sigmoidoscopy):  Excessive amounts of blood in the stool  Significant tenderness or worsening of abdominal pains  Swelling of the abdomen that is new, acute  Fever of 100F or higher    For urgent or emergent issues, a gastroenterologist can be reached at any hour by calling [phone]. Do not use MyChart messaging for urgent concerns.    DIET:  We do recommend a small meal at first, but then you may proceed to your regular diet.  Drink plenty of fluids but you should avoid alcoholic beverages for 24 hours.  ACTIVITY:  You should plan to take it easy for the rest of today and you should NOT DRIVE or use heavy machinery until tomorrow (because  of the sedation medicines used during the test).    FOLLOW UP: Our staff will call the number listed on your records 48-72 hours following your procedure to check on you and address any questions or concerns that you may have regarding the information given to you following your procedure. If we do not reach you, we will leave a message.  We will attempt to reach you two times.  During this call, we will ask if you have developed any symptoms of COVID 19. If you develop any symptoms (ie: fever, flu-like symptoms, shortness of breath, cough etc.) before then, please call [phone].  If you test positive for Covid 19 in the 2 weeks post procedure, please call and report this information to [place].    If any biopsies were taken you will be contacted by phone or by letter within the next 1-3 weeks.  Please call us at [phone] if you have not heard about the biopsies in 3 weeks.    SIGNATURES/CONFIDENTIALITY: You and/or your care partner have signed paperwork which will be entered into your electronic medical record.  These signatures attest to the fact that that the information above on your After Visit Summary has been reviewed and is understood.  Full responsibility of the confidentiality of this discharge information lies with you and/or your care-partner.    RESUME MEDICATIONS. INFORMATION GIVEN ON POLYPS AND DIVERTICULOSIS.

## 2021-01-31 NOTE — Progress Notes (Signed)
Pt's states no medical or surgical changes since previsit or office visit. VS assessed by C.W 

## 2021-01-31 NOTE — Op Note (Signed)
[facility] Patient Name: Elizabeth Branch Procedure Date: 01/31/2021 10:05 AM MRN: [id number] Endoscopist: Mallie Mussel L. Loletha Carrow , MD Age: 75 Referring MD:  Date of Birth: 1945-06-10 Gender: Female Account #: [id number] Procedure:                Colonoscopy Indications:              Surveillance: Personal history of colonic polyps                            (unknown histology) on last colonoscopy more than 5                            years ago                           diminutive polyp (not retrieved) 09/2015 Medicines:                Monitored Anesthesia Care Procedure:                Pre-Anesthesia Assessment:                           - Prior to the procedure, a History and Physical                            was performed, and patient medications and                            allergies were reviewed. The patient's tolerance of                            previous anesthesia was also reviewed. The risks                            and benefits of the procedure and the sedation                            options and risks were discussed with the patient.                            All questions were answered, and informed consent                            was obtained. Prior Anticoagulants: The patient has                            taken no previous anticoagulant or antiplatelet                            agents. ASA Grade Assessment: II - A patient with                            mild systemic disease. After reviewing the risks  and benefits, the patient was deemed in                            satisfactory condition to undergo the procedure.                           After obtaining informed consent, the colonoscope                            was passed under direct vision. Throughout the                            procedure, the patient's blood pressure, pulse, and                            oxygen saturations were monitored continuously. The                             Olympus CF-HQ190L ([id number]) Colonoscope was                            introduced through the anus and advanced to the the                            cecum, identified by appendiceal orifice and                            ileocecal valve. The colonoscopy was performed                            without difficulty. The patient tolerated the                            procedure well. The quality of the bowel                            preparation was good. The ileocecal valve,                            appendiceal orifice, and rectum were photographed. Scope In: 10:26:49 AM Scope Out: 10:42:14 AM Scope Withdrawal Time: 0 hours 9 minutes 56 seconds  Total Procedure Duration: 0 hours 15 minutes 25 seconds  Findings:                 The digital rectal exam findings include decreased                            sphincter tone.                           Multiple diverticula were found in the left colon                            and right colon.  Two sessile polyps were found in the descending                            colon. The polyps were diminutive in size. These                            polyps were removed with a cold snare. Resection                            and retrieval were complete.                           Retroflexion in the rectum was not performed due to                            narrow anatomy.                           The exam was otherwise without abnormality. Complications:            No immediate complications. Estimated Blood Loss:     Estimated blood loss was minimal. Impression:               - Decreased sphincter tone found on digital rectal                            exam.                           - Diverticulosis in the left colon and in the right                            colon.                           - Two diminutive polyps in the descending colon,                            removed with a cold snare.  Resected and retrieved.                           - The examination was otherwise normal. Recommendation:           - Patient has a contact number available for                            emergencies. The signs and symptoms of potential                            delayed complications were discussed with the                            patient. Return to normal activities tomorrow.                            Written discharge instructions were provided  to the                            patient.                           - Resume previous diet.                           - Continue present medications.                           - Await pathology results.                           - No repeat routine surveilance colonoscopy                            recommendeddue to age, current guidelines and low                            risk polyp history. Charisa Twitty L. Loletha Carrow, MD 01/31/2021 10:47:12 AM This report has been signed electronically.

## 2021-02-02 ENCOUNTER — Telehealth: Payer: Self-pay | Admitting: *Deleted

## 2021-02-02 NOTE — Telephone Encounter (Signed)
  Follow up Call-  Call back number 01/31/2021  Post procedure Call Back phone  # [phone]  Permission to leave phone message Yes  Some recent data might be hidden     Patient questions:  Do you have a fever, pain , or abdominal swelling? No. Pain Score  0 *  Have you tolerated food without any problems? Yes.    Have you been able to return to your normal activities? Yes.    Do you have any questions about your discharge instructions: Diet   No. Medications  No. Follow up visit  No.  Do you have questions or concerns about your Care? No.  Actions: * If pain score is 4 or above: No action needed, pain <4.  Have you developed a fever since your procedure? no  2.   Have you had an respiratory symptoms (SOB or cough) since your procedure? no  3.   Have you tested positive for COVID 19 since your procedure no  4.   Have you had any family members/close contacts diagnosed with the COVID 19 since your procedure?  no   If yes to any of these questions please route to Joylene John, RN and Joella Prince, RN

## 2021-02-06 ENCOUNTER — Encounter: Payer: Self-pay | Admitting: Gastroenterology

## 2021-10-23 IMAGING — CT CT ABDOMEN W/O CM
2 of 3 series · 15 of 46 positions shown, 17 images · non-contrast
Comparison: 04/03/2010

CLINICAL DATA: Follow-up of right adrenal nodule.

EXAM:
CT ABDOMEN WITHOUT CONTRAST
TECHNIQUE: Multidetector CT imaging of the abdomen was performed following the
standard protocol without IV contrast.

[Series 4: coronal wo · coronal · 0.40mm/px · 3 of 128 slices shown]
[im 43/128  soft-tissue]
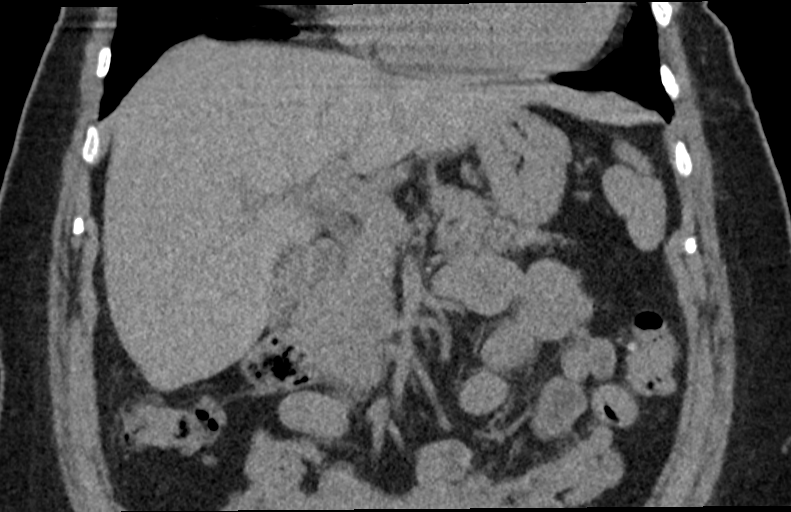
[im 57/128  soft-tissue]
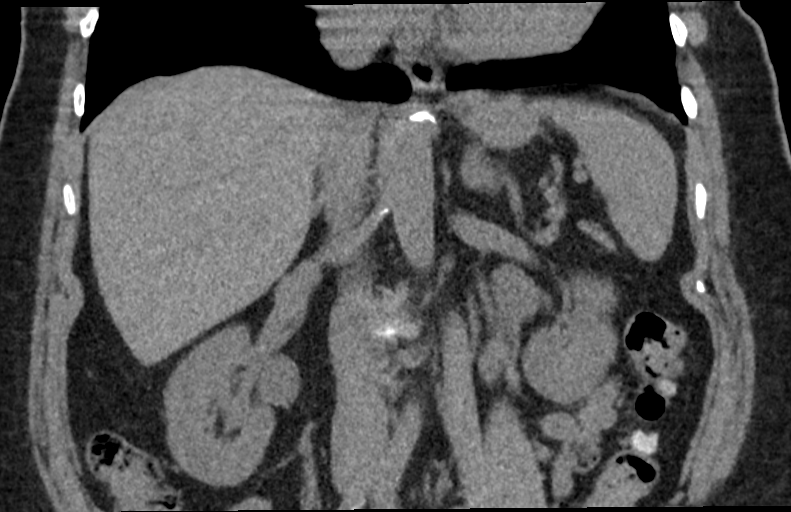
[im 71/128  soft-tissue]
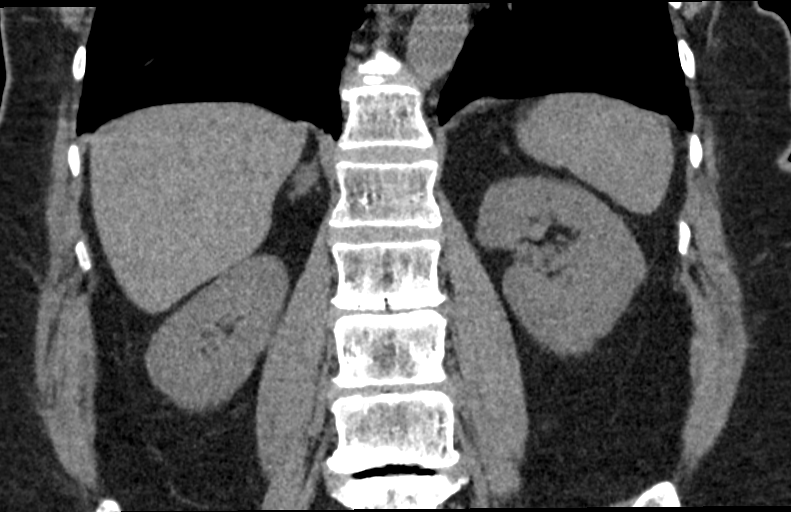

[Series 6: axial st · axial · 0.66mm/px · z∈[-872,-698]mm · 12 of 68 slices shown, 14 images]
[im 5/68  soft-tissue]
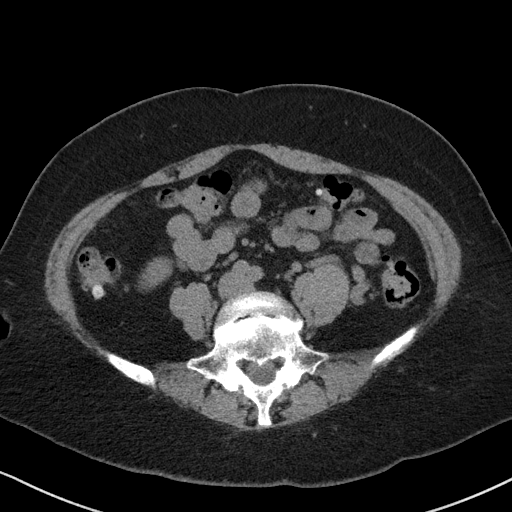
[im 5/68  bone]
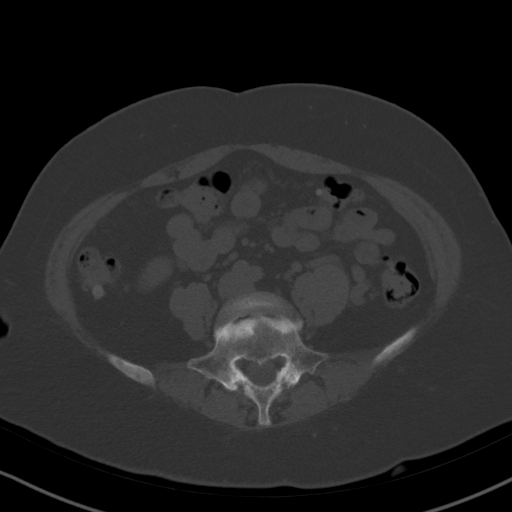
[im 9/68  soft-tissue]
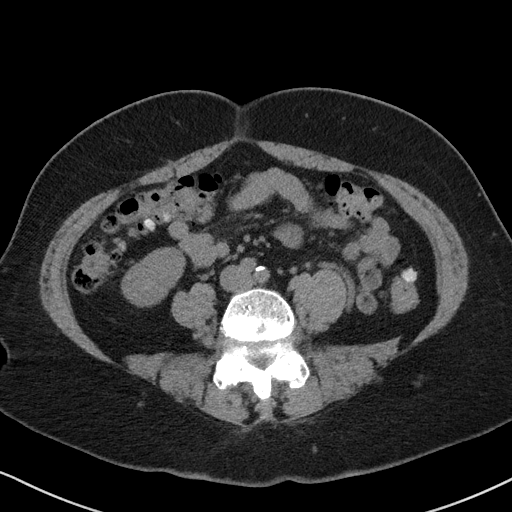
[im 16/68  soft-tissue]
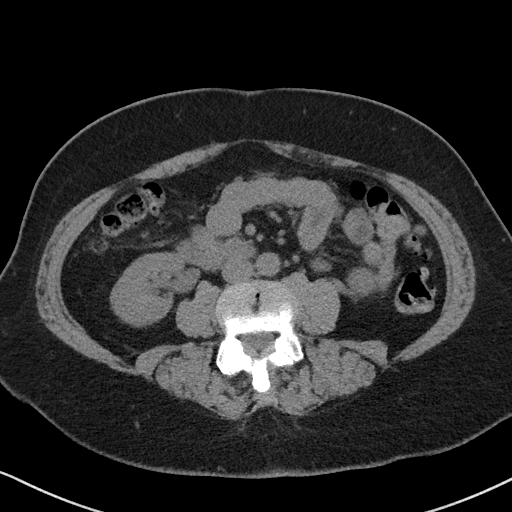
[im 20/68  soft-tissue]
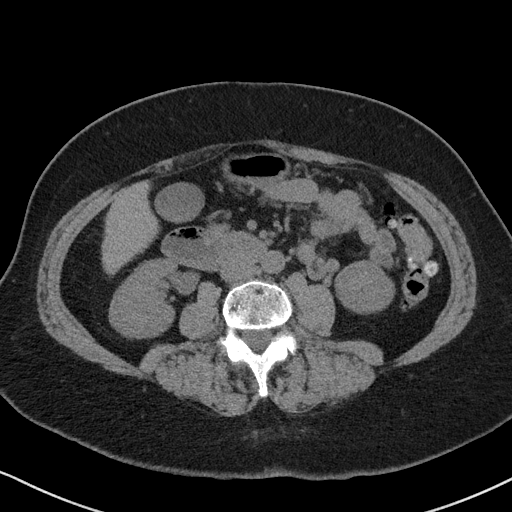
[im 26/68  soft-tissue]
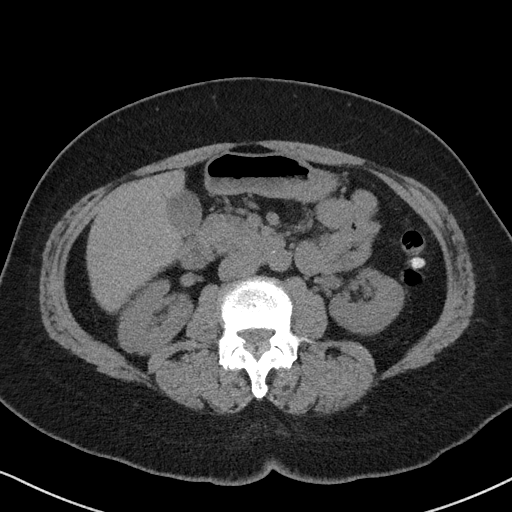
[im 31/68  soft-tissue]
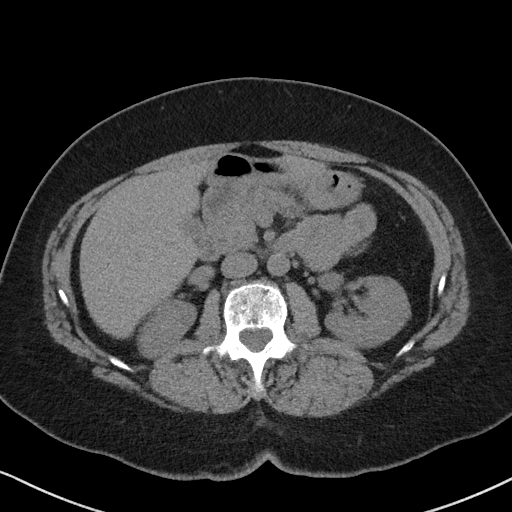
[im 37/68  soft-tissue]
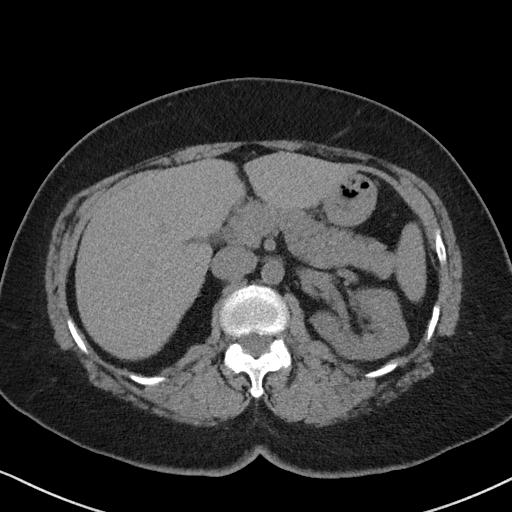
[im 42/68  soft-tissue]
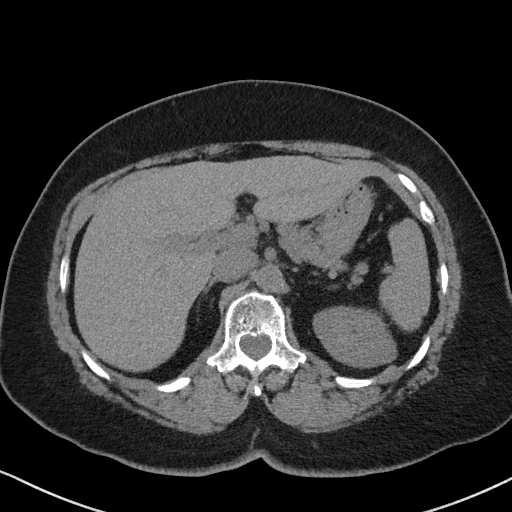
[im 48/68  soft-tissue]
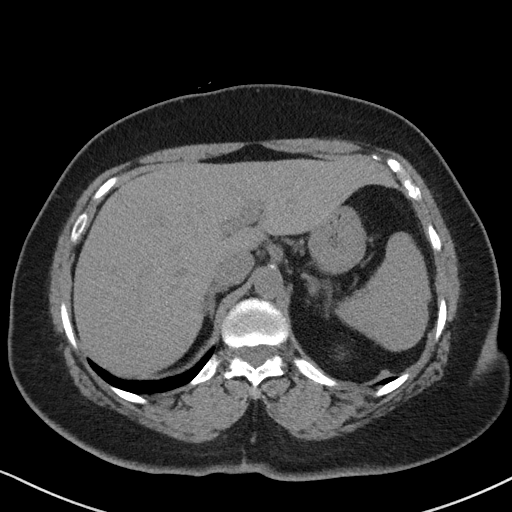
[im 48/68  bone]
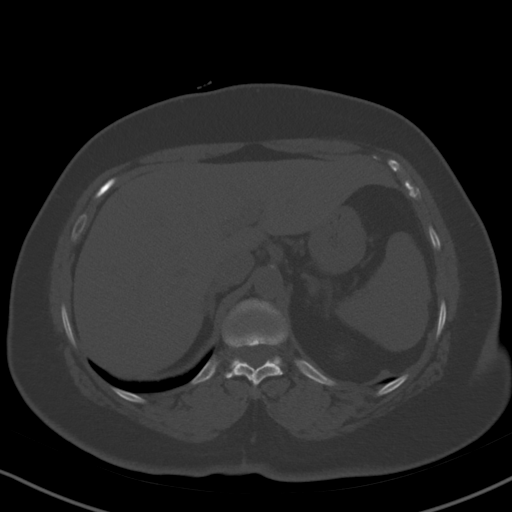
[im 52/68  soft-tissue]
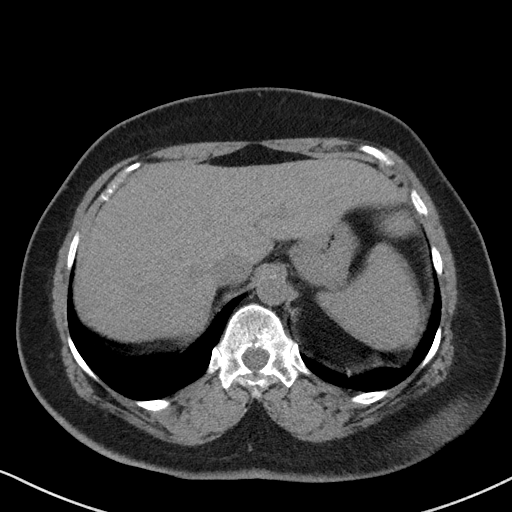
[im 59/68  soft-tissue]
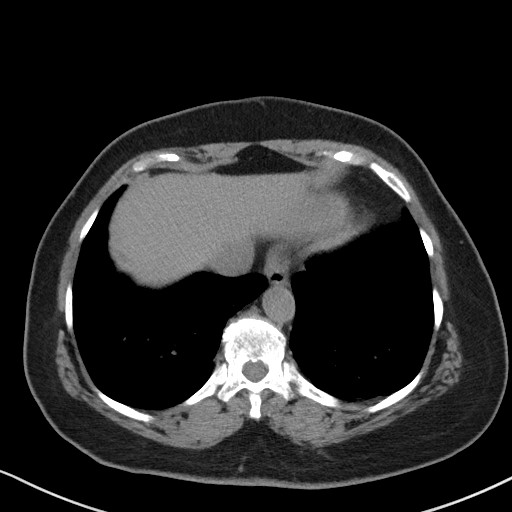
[im 63/68  soft-tissue]
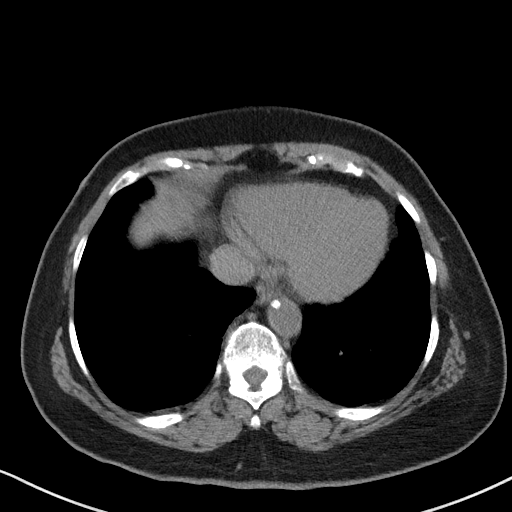

[15 of 46 positions shown; findings below may reference images not displayed]

FINDINGS: Lower chest: Bibasilar scarring. Normal heart size without
pericardial or pleural effusion.

Hepatobiliary: Normal noncontrast appearance of the liver. Normal
gallbladder, without biliary ductal dilatation.

Pancreas: Normal, without mass or ductal dilatation.

Spleen: Atypical splenic contour or is unchanged, likely within
normal variation.

Adrenals/Urinary Tract: Normal left adrenal gland.

Right adrenal nodule measures 1.5 cm on [DATE] and is similar to on
the prior exam (when remeasured). This demonstrates indeterminate
density measurements, 27 HU.

No renal calculi.  Bilateral extrarenal pelves

Stomach/Bowel: Tiny hiatal hernia. Extensive colonic diverticulosis.
Normal abdominal small bowel loops.

Vascular/Lymphatic: Aortic atherosclerosis. No abdominal adenopathy.

Other: Periumbilical fat containing ventral abdominal wall laxity.
No ascites.

Musculoskeletal: Lumbar spondylosis with L4-5 grade 1
anterolisthesis, likely degenerative.
IMPRESSION: 1. Right adrenal nodule, similar in size compared to the prior exam
greater than 10 years ago. This can be presumed benign. Most likely
a lipid poor adenoma.
2.  No acute abdominal process.
3.  Aortic Atherosclerosis ([id number]).

## 2022-03-29 ENCOUNTER — Ambulatory Visit (INDEPENDENT_AMBULATORY_CARE_PROVIDER_SITE_OTHER): Payer: Medicare HMO | Admitting: Orthopaedic Surgery

## 2022-03-29 ENCOUNTER — Encounter: Payer: Self-pay | Admitting: Orthopaedic Surgery

## 2022-03-29 ENCOUNTER — Ambulatory Visit (INDEPENDENT_AMBULATORY_CARE_PROVIDER_SITE_OTHER): Payer: Medicare HMO

## 2022-03-29 DIAGNOSIS — M1611 Unilateral primary osteoarthritis, right hip: Secondary | ICD-10-CM | POA: Insufficient documentation

## 2022-03-29 NOTE — Progress Notes (Signed)
Office Visit Note   Patient: Elizabeth Branch           Date of Birth: [birth year]           MRN: [id number] Visit Date: 03/29/2022              Requested by: Josephine Cables, MD [place],  [place] [address] PCP: Josephine Cables, MD   Assessment & Plan: Visit Diagnoses:  1. Primary osteoarthritis of right hip     Plan: Impression is severe right hip OA.  She has complete destruction of the hip joint.  I am impressed that she has been able to live with this for years but it sounds like at this point the pain has become too severe to manage with medications and other conservative treatments.  Based on her options she has elected to move forward with a right total hip replacement.  Given the amount of deformity I would like to get a CT scan for presurgical planning.  She will also see a dentist to establish care prior to hip surgery.  She will call us back once that has been completed.  We will also obtain PCP clearance for surgery.  Follow-Up Instructions: No follow-ups on file.   Orders:  Orders Placed This Encounter  Procedures   XR HIP UNILAT W OR W/O PELVIS 2-3 VIEWS RIGHT   No orders of the defined types were placed in this encounter.     Procedures: No procedures performed   Clinical Data: No additional findings.   Subjective: Chief Complaint  Patient presents with   Right Hip - Pain    HPI Elizabeth Branch is a very pleasant 76 year old female who comes in for chronic severe right hip pain for years.  She has avoided hip surgery for years and saw Dr. Jaclynn Guarneri at [facility] about 2 years ago.  At that time she was still able to do most activities and ADLs.  Recently she has had significant worsening in her symptoms.  She has deep-seated hip pain and thigh pain.  She is unable to ambulate without a cane now.  Denies any radicular symptoms.  Review of Systems  Constitutional: Negative.   HENT: Negative.    Eyes: Negative.   Respiratory: Negative.    Cardiovascular:  Negative.   Endocrine: Negative.   Musculoskeletal: Negative.   Neurological: Negative.   Hematological: Negative.   Psychiatric/Behavioral: Negative.    All other systems reviewed and are negative.    Objective: Vital Signs: There were no vitals taken for this visit.  Physical Exam Vitals and nursing note reviewed.  Constitutional:      Appearance: She is well-developed.  HENT:     Head: Atraumatic.     Nose: Nose normal.  Eyes:     Extraocular Movements: Extraocular movements intact.  Cardiovascular:     Pulses: Normal pulses.  Pulmonary:     Effort: Pulmonary effort is normal.  Abdominal:     Palpations: Abdomen is soft.  Musculoskeletal:     Cervical back: Neck supple.  Skin:    General: Skin is warm.     Capillary Refill: Capillary refill takes less than 2 seconds.  Neurological:     Mental Status: She is alert. Mental status is at baseline.  Psychiatric:        Behavior: Behavior normal.        Thought Content: Thought content normal.        Judgment: Judgment normal.     Ortho Exam  Examination of the right hip shows leg length discrepancy.  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  She has significant limitation with hip flexion and internal/external rotation.  Lateral hip is nontender.  Specialty Comments:  No specialty comments available.  Imaging: XR HIP UNILAT W OR W/O PELVIS 2-3 VIEWS RIGHT  Result Date: 03/29/2022 Severe right hip OA with complete destruction of the joint and remodeling of the femoral head.  Large superior acetabular osteophyte.  Bone-on-bone.    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   Past Medical History:   Diagnosis Date   Allergy    Arthritis    Asthma    Blood transfusion without reported diagnosis    Cataract    Heart murmur    Hypertension    Seasonal allergies     Family History  Problem Relation Age of Onset   Colon cancer Cousin    Breast cancer Maternal Aunt     Past Surgical History:  Procedure Laterality Date   ABDOMINAL HYSTERECTOMY     APPENDECTOMY     CATARACT EXTRACTION     TONSILLECTOMY     Social History   Occupational History   Not on file  Tobacco Use   Smoking status: Never   Smokeless tobacco: Never  Substance and Sexual Activity   Alcohol use: Yes    Comment: occassional   Drug use: No   Sexual activity: Not on file

## 2022-05-08 ENCOUNTER — Telehealth: Payer: Self-pay | Admitting: Orthopaedic Surgery

## 2022-05-08 NOTE — Telephone Encounter (Signed)
Pt called stating she had ana ppt with Dr Erlinda Hong and Dr Erlinda Hong is asking for a letter of clearance from her dental office she is cleared for surgery. Dental office is asking what do Dr Erlinda Hong what them to say in this letter. Please send to [place] and phone number [phone]. Please mail them this letter to address [place] [address].Pt states she has 3 broken teeth and also need to know do they need to be removed before surgery. Please call pt also at [phone].

## 2022-05-08 NOTE — Telephone Encounter (Signed)
If her dentist can let us know if she has no active infection and that she can proceed with surgery from a dental stand point.

## 2022-05-09 NOTE — Telephone Encounter (Signed)
Tried to call patient. No answer. Called dentist and spoke with them. They will fax over letter stating no infection so patient can proceed with surgery. Provided fax number.

## 2022-05-16 ENCOUNTER — Telehealth: Payer: Self-pay

## 2022-05-16 NOTE — Telephone Encounter (Signed)
Tried to call patient. No answer. No voicemail. Dr.Xu wants patient to come in for another appointment before scheduling surgery. We received fax from dental office. Will place letter in upcoming appointment folder.

## 2022-05-23 NOTE — Telephone Encounter (Signed)
Tried to call again. No answer. No voicemail.

## 2022-05-24 ENCOUNTER — Telehealth: Payer: Self-pay | Admitting: Orthopaedic Surgery

## 2022-05-24 NOTE — Telephone Encounter (Signed)
I called her.  No answer.  No voicemail

## 2022-05-24 NOTE — Telephone Encounter (Signed)
Patient called in stating she would need a new order for her MRI put in and also a Valium put in for when she gets it please advise, Patient would like to know why she needs to come in for appt before going to dentist she would like a call about that

## 2022-09-04 ENCOUNTER — Telehealth: Payer: Self-pay | Admitting: Orthopaedic Surgery

## 2022-09-04 NOTE — Telephone Encounter (Signed)
Elizabeth Branch returned call to Dr Erlinda Hong from Dec. Elizabeth Branch is asking for a call back to Dr Erlinda Hong to move forward for surgery. Elizabeth Branch aslo need to know about the dental office. Elizabeth Branch phone number is [phone].

## 2022-09-04 NOTE — Telephone Encounter (Signed)
Scheduled follow up with Dr.Xu for next week, per Dr.Xu's request.

## 2022-09-12 ENCOUNTER — Ambulatory Visit: Payer: Medicare HMO | Admitting: Orthopaedic Surgery

## 2022-09-12 ENCOUNTER — Other Ambulatory Visit (INDEPENDENT_AMBULATORY_CARE_PROVIDER_SITE_OTHER): Payer: Medicare HMO

## 2022-09-12 DIAGNOSIS — M1611 Unilateral primary osteoarthritis, right hip: Secondary | ICD-10-CM

## 2022-09-12 NOTE — Progress Notes (Signed)
Office Visit Note   Patient: Elizabeth Branch           Date of Birth: 1946-01-25           MRN: [id number] Visit Date: 09/12/2022              Requested by: Ethelda Chick, MD [address] [place],  [place] [address] PCP: Ethelda Chick, MD   Assessment & Plan: Visit Diagnoses:  1. Primary osteoarthritis of right hip     Plan: Impression is severe right hip DJD with bone-on-bone joint space narrowing.  She has significant wear to the superior acetabulum and lateral subluxation of the femoral head.  Will need CT scan for presurgical planning.  At this point her dental issues have stabilized and she is ready for surgery.  We will get necessary preoperative medical clearance from PCP.  Details of surgery including risk benefits prognosis reviewed.  Patient will likely require short-term SNF postoperatively.  Eunice Blase will call the patient to schedule surgery once we have the necessary clearances.  Impression is severe right hip degenerative joint disease secondary to Osteoarthritis.  Imaging shows bone on bone joint space narrowing.  At this point, conservative treatments fail to provide any significant relief and the pain is severely affecting ADLs and quality of life.  Based on treatment options, the patient has elected to move forward with a hip replacement.  We have discussed the surgical risks that include but are not limited to infection, DVT, leg length discrepancy, numbness, tingling, incomplete relief of pain.  Recovery and prognosis were also reviewed.    Current anticoagulants: No antithrombotic Postop anticoagulation: Aspirin 81 mg Diabetic: No  Prior DVT/PE: No Tobacco use: No Clearances needed for surgery: Ethelda Chick, PCP Anticipate discharge dispo: SNF   Follow-Up Instructions: No follow-ups on file.   Orders:  Orders Placed This Encounter  Procedures   XR HIP UNILAT W OR W/O PELVIS 2-3 VIEWS RIGHT   No orders of the defined types were placed in this  encounter.     Procedures: No procedures performed   Clinical Data: No additional findings.   Subjective: Chief Complaint  Patient presents with   Right Hip - Follow-up    HPI  [name] returns today for follow-up on severe right hip DJD.  Review of Systems  Constitutional: Negative.   HENT: Negative.    Eyes: Negative.   Respiratory: Negative.    Cardiovascular: Negative.   Endocrine: Negative.   Musculoskeletal: Negative.   Neurological: Negative.   Hematological: Negative.   Psychiatric/Behavioral: Negative.    All other systems reviewed and are negative.    Objective: Vital Signs: There were no vitals taken for this visit.  Physical Exam Vitals and nursing note reviewed.  Constitutional:      Appearance: She is well-developed.  HENT:     Head: Normocephalic and atraumatic.  Pulmonary:     Effort: Pulmonary effort is normal.  Abdominal:     Palpations: Abdomen is soft.  Musculoskeletal:     Cervical back: Neck supple.  Skin:    General: Skin is warm.     Capillary Refill: Capillary refill takes less than 2 seconds.  Neurological:     Mental Status: She is alert and oriented to person, place, and time.  Psychiatric:        Behavior: Behavior normal.        Thought Content: Thought content normal.        Judgment: Judgment normal.  Ortho Exam  Examination of right hip shows a significant leg length discrepancy.  Minimal range of motion with severe pain with any movement of the hip joint.  Specialty Comments:  No specialty comments available.  Imaging: No results found.   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   Past Medical History:  Diagnosis Date   Allergy    Arthritis    Asthma    Blood  transfusion without reported diagnosis    Cataract    Heart murmur    Hypertension    Seasonal allergies     Family History  Problem Relation Age of Onset   Colon cancer Cousin    Breast cancer Maternal Aunt     Past Surgical History:  Procedure Laterality Date   ABDOMINAL HYSTERECTOMY     APPENDECTOMY     CATARACT EXTRACTION     TONSILLECTOMY     Social History   Occupational History   Not on file  Tobacco Use   Smoking status: Never   Smokeless tobacco: Never  Substance and Sexual Activity   Alcohol use: Yes    Comment: occassional   Drug use: No   Sexual activity: Not on file

## 2022-09-14 ENCOUNTER — Telehealth: Payer: Self-pay | Admitting: Orthopaedic Surgery

## 2022-09-14 NOTE — Telephone Encounter (Signed)
Patient has received clearance for right total hip replacement from PCP.  Called patient to discuss surgery, however no answer and voicemail is full.

## 2022-09-26 ENCOUNTER — Telehealth: Payer: Self-pay | Admitting: Orthopaedic Surgery

## 2022-09-26 NOTE — Telephone Encounter (Signed)
Called patient to offer dates for RIGHT TOTAL HIP SURGER with Dr. Roda Shutters.  Voicemail is full and unable to leave message.

## 2022-11-03 HISTORY — PX: BIOPSY TONGUE: PRO39

## 2022-11-03 HISTORY — PX: PTERYGIUM EXCISION: SHX2273

## 2022-11-22 ENCOUNTER — Ambulatory Visit: Payer: Medicare HMO

## 2022-11-22 DIAGNOSIS — M1611 Unilateral primary osteoarthritis, right hip: Secondary | ICD-10-CM

## 2022-11-23 LAB — HEMOGLOBIN A1C
Hgb A1c MFr Bld: 5.8 % of total Hgb — ABNORMAL HIGH (ref <5.7)
Mean Plasma Glucose: 120 mg/dL
eAG (mmol/L): 6.6 mmol/L

## 2022-11-23 LAB — PREALBUMIN: Prealbumin: 27 mg/dL (ref 17–34)

## 2022-11-28 NOTE — Pre-Procedure Instructions (Signed)
Surgical Instructions    Your procedure is scheduled on December 10, 2022.  Report to [facility] Main Entrance "A" at 7:10 A.M., then check in with the Admitting office.  Call this number if you have problems the morning of surgery:  [phone]  If you have any questions prior to your surgery date call [phone]: Open Monday-Friday 8am-4pm If you experience any cold or flu symptoms such as cough, fever, chills, shortness of breath, etc. between now and your scheduled surgery, please notify us at the above number.     Remember:  Do not eat after midnight the night before your surgery  You may drink clear liquids until 6:40 AM the morning of your surgery.   Clear liquids allowed are: Water, Non-Citrus Juices (without pulp), Carbonated Beverages, Clear Tea, Black Coffee Only (NO MILK, CREAM OR POWDERED CREAMER of any kind), and Gatorade.  Patient Instructions  The night before surgery:  No food after midnight. ONLY clear liquids after midnight  The day of surgery (if you do NOT have diabetes):  Drink ONE (1) Pre-Surgery Clear Ensure by 6:40 AM the morning of surgery. Drink in one sitting. Do not sip.  This drink was given to you during your hospital  pre-op appointment visit.  Nothing else to drink after completing the  Pre-Surgery Clear Ensure.         If you have questions, please contact your surgeon's office.     Take these medicines the morning of surgery with A SIP OF WATER:  atenolol (TENORMIN)   benzonatate (TESSALON)   Polyethyl Glycol-Propyl Glycol eye drops    May take these medicines IF NEEDED:  acetaminophen (TYLENOL)   albuterol (PROVENTIL HFA;VENTOLIN HFA) 108 (90 Base)     As of today, STOP taking any Aspirin (unless otherwise instructed by your surgeon) Aleve, Naproxen, Ibuprofen, Motrin, Advil, Goody's, BC's, all herbal medications, fish oil, and all vitamins. This includes your medication: celecoxib (CELEBREX)                      Do NOT Smoke  (Tobacco/Vaping) for 24 hours prior to your procedure.  If you use a CPAP at night, you may bring your mask/headgear for your overnight stay.   Contacts, glasses, piercing's, hearing aid's, dentures or partials may not be worn into surgery, please bring cases for these belongings.    For patients admitted to the hospital, discharge time will be determined by your treatment team.   Patients discharged the day of surgery will not be allowed to drive home, and someone needs to stay with them for 24 hours.  SURGICAL WAITING ROOM VISITATION Patients having surgery or a procedure may have no more than 2 support people in the waiting area - these visitors may rotate.   Children under the age of 42 must have an adult with them who is not the patient. If the patient needs to stay at the hospital during part of their recovery, the visitor guidelines for inpatient rooms apply. Pre-op nurse will coordinate an appropriate time for 1 support person to accompany patient in pre-op.  This support person may not rotate.   Please refer to the [facility] website for the visitor guidelines for Inpatients (after your surgery is over and you are in a regular room).   If you received a COVID test during your pre-op visit  it is requested that you wear a mask when out in public, stay away from anyone that may not be feeling well and  notify your surgeon if you develop symptoms. If you have been in contact with anyone that has tested positive in the last 10 days please notify you surgeon.    Pre-operative 5 CHG Bath Instructions   You can play a key role in reducing the risk of infection after surgery. Your skin needs to be as free of germs as possible. You can reduce the number of germs on your skin by washing with CHG (chlorhexidine gluconate) soap before surgery. CHG is an antiseptic soap that kills germs and continues to kill germs even after washing.   DO NOT use if you have an allergy to chlorhexidine/CHG or  antibacterial soaps. If your skin becomes reddened or irritated, stop using the CHG and notify one of our RNs at [phone].   Please shower with the CHG soap starting 4 days before surgery using the following schedule:     Please keep in mind the following:  DO NOT shave, including legs and underarms, starting the day of your first shower.   You may shave your face at any point before/day of surgery.  Place clean sheets on your bed the day you start using CHG soap. Use a clean washcloth (not used since being washed) for each shower. DO NOT sleep with pets once you start using the CHG.   CHG Shower Instructions:  If you choose to wash your hair and private area, wash first with your normal shampoo/soap.  After you use shampoo/soap, rinse your hair and body thoroughly to remove shampoo/soap residue.  Turn the water OFF and apply about 3 tablespoons (45 ml) of CHG soap to a CLEAN washcloth.  Apply CHG soap ONLY FROM YOUR NECK DOWN TO YOUR TOES (washing for 3-5 minutes)  DO NOT use CHG soap on face, private areas, open wounds, or sores.  Pay special attention to the area where your surgery is being performed.  If you are having back surgery, having someone wash your back for you may be helpful. Wait 2 minutes after CHG soap is applied, then you may rinse off the CHG soap.  Pat dry with a clean towel  Put on clean clothes/pajamas   If you choose to wear lotion, please use ONLY the CHG-compatible lotions on the back of this paper.     Additional instructions for the day of surgery: DO NOT APPLY any lotions, deodorants, cologne, or perfumes.   Do not wear jewelry or makeup Do not wear nail polish, gel polish, artificial nails, or any other type of covering on natural nails (fingers and toes) Do not bring valuables to the hospital. [facility] is not responsible for any belongings or valuables. Put on clean/comfortable clothes.  Brush your teeth.  Ask your nurse before applying any  prescription medications to the skin.      CHG Compatible Lotions   Aveeno Moisturizing lotion  Cetaphil Moisturizing Cream  Cetaphil Moisturizing Lotion  Clairol Herbal Essence Moisturizing Lotion, Dry Skin  Clairol Herbal Essence Moisturizing Lotion, Extra Dry Skin  Clairol Herbal Essence Moisturizing Lotion, Normal Skin  Curel Age Defying Therapeutic Moisturizing Lotion with Alpha Hydroxy  Curel Extreme Care Body Lotion  Curel Soothing Hands Moisturizing Hand Lotion  Curel Therapeutic Moisturizing Cream, Fragrance-Free  Curel Therapeutic Moisturizing Lotion, Fragrance-Free  Curel Therapeutic Moisturizing Lotion, Original Formula  Eucerin Daily Replenishing Lotion  Eucerin Dry Skin Therapy Plus Alpha Hydroxy Crme  Eucerin Dry Skin Therapy Plus Alpha Hydroxy Lotion  Eucerin Original Crme  Eucerin Original Lotion  Eucerin Plus Crme Eucerin  Plus Lotion  Eucerin TriLipid Replenishing Lotion  Keri Anti-Bacterial Hand Lotion  Keri Deep Conditioning Original Lotion Dry Skin Formula Softly Scented  Keri Deep Conditioning Original Lotion, Fragrance Free Sensitive Skin Formula  Keri Lotion Fast Absorbing Fragrance Free Sensitive Skin Formula  Keri Lotion Fast Absorbing Softly Scented Dry Skin Formula  Keri Original Lotion  Keri Skin Renewal Lotion Keri Silky Smooth Lotion  Keri Silky Smooth Sensitive Skin Lotion  Nivea Body Creamy Conditioning Oil  Nivea Body Extra Enriched Lotion  Nivea Body Original Lotion  Nivea Body Sheer Moisturizing Lotion Nivea Crme  Nivea Skin Firming Lotion  NutraDerm 30 Skin Lotion  NutraDerm Skin Lotion  NutraDerm Therapeutic Skin Cream  NutraDerm Therapeutic Skin Lotion  ProShield Protective Hand Cream  Provon moisturizing lotion    Please read over the following fact sheets that you were given.

## 2022-11-29 ENCOUNTER — Other Ambulatory Visit: Payer: Self-pay

## 2022-11-29 ENCOUNTER — Encounter (HOSPITAL_COMMUNITY): Payer: Self-pay

## 2022-11-29 ENCOUNTER — Encounter (HOSPITAL_COMMUNITY)
Admission: RE | Admit: 2022-11-29 | Discharge: 2022-11-29 | Disposition: A | Discharge status: Home / Self Care | Payer: Medicare HMO | Source: Ambulatory Visit | Attending: Orthopaedic Surgery | Admitting: Orthopaedic Surgery

## 2022-11-29 VITALS — BP 168/62 | HR 75 | Temp 98.2°F | Resp 17 | Ht 62.0 in | Wt 160.0 lb

## 2022-11-29 DIAGNOSIS — M1611 Unilateral primary osteoarthritis, right hip: Secondary | ICD-10-CM

## 2022-11-29 DIAGNOSIS — I10 Essential (primary) hypertension: Secondary | ICD-10-CM | POA: Insufficient documentation

## 2022-11-29 DIAGNOSIS — Z01818 Encounter for other preprocedural examination: Secondary | ICD-10-CM | POA: Insufficient documentation

## 2022-11-29 DIAGNOSIS — Z87891 Personal history of nicotine dependence: Secondary | ICD-10-CM | POA: Insufficient documentation

## 2022-11-29 DIAGNOSIS — I498 Other specified cardiac arrhythmias: Secondary | ICD-10-CM | POA: Insufficient documentation

## 2022-11-29 DIAGNOSIS — J45909 Unspecified asthma, uncomplicated: Secondary | ICD-10-CM | POA: Insufficient documentation

## 2022-11-29 LAB — CBC
HCT: 39.2 % (ref 36.0–46.0)
Hemoglobin: 12.5 g/dL (ref 12.0–15.0)
MCH: 28.7 pg (ref 26.0–34.0)
MCHC: 31.9 g/dL (ref 30.0–36.0)
MCV: 90.1 fL (ref 80.0–100.0)
Platelets: 264 10*3/uL (ref 150–400)
RBC: 4.35 MIL/uL (ref 3.87–5.11)
RDW: 12.2 % (ref 11.5–15.5)
WBC: 5.9 10*3/uL (ref 4.0–10.5)
nRBC: 0 % (ref 0.0–0.2)

## 2022-11-29 LAB — TYPE AND SCREEN
ABO/RH(D): AB NEG
Antibody Screen: NEGATIVE

## 2022-11-29 LAB — BASIC METABOLIC PANEL
Anion gap: 13 (ref 5–15)
BUN: 23 mg/dL (ref 8–23)
CO2: 26 mmol/L (ref 22–32)
Calcium: 9.8 mg/dL (ref 8.9–10.3)
Chloride: 98 mmol/L (ref 98–111)
Creatinine, Ser: 0.94 mg/dL (ref 0.44–1.00)
GFR, Estimated: >60 mL/min (ref >60)
Glucose, Bld: 106 mg/dL — ABNORMAL HIGH (ref 70–99)
Potassium: 3.7 mmol/L (ref 3.5–5.1)
Sodium: 137 mmol/L (ref 135–145)

## 2022-11-29 LAB — SURGICAL PCR SCREEN
MRSA, PCR: NEGATIVE
Staphylococcus aureus: POSITIVE — AB

## 2022-11-29 NOTE — Progress Notes (Signed)
Anesthesia APP Evaluation:  Case: [id number] Date/Time: 12/10/22 0929   Procedure: RIGHT TOTAL HIP ARTHROPLASTY ANTERIOR APPROACH (Right: Hip) - 3-C   Anesthesia type: Spinal   Pre-op diagnosis: right hip osteoarthritis   Location: [facility] ROOM 06 / [facility]   Surgeons: Tarry Kos, MD       DISCUSSION: Patient is a 77 year old female scheduled for the above procedure.  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 She had follow-up with Dr. Reside on 11/26/22. Biopsy site was noted to have healed nicely. Surveillance to be scheduled following her hip surgery.   She had an echo ordered by her PCP Ethelda Chick, MD back on  12/17/19 that showed LVEF 55-60%, no regional wall motion abnormalities, grade 1 diastolic dysfunction, normal RV systolic function, normal MV structure but unable to exclude mild MVP, mild-moderate MR, mild AR. Dr. Su Hilt signed a surgical clearance note on 09/21/22 clearing her from both a medical and cardiac standpoint. Office note requested.  Reviewed 2021 echo results and exam findings with anesthesiologists Dr. Mal Amabile and Dr. Salvadore Farber. Patient has a long term PCP who cleared her for surgery recently. She denied CV/HF symptoms. Labs acceptable for OR. Anesthesia team to evaluate on the day of surgery.    VS: BP (!) 168/62   Pulse 75   Temp 36.8 C   Resp 17   Ht 5\' 2"  (1.575 m)   Wt 72.6 kg   SpO2 96%   BMI 29.26 kg/m  Patient is hospital wheelchair. She is currently using 2 canes to walk due to hip pain, but has used one cane for a while now more for balance issues due to her right leg being slightly shorter than her left. She denied chest pain, SOB, edema, palpitations, orthopnea. She is not  able to be very active due to hip and knee pain. She does some gardening, but not yet this year. She is able to go up and down stairs to her basement without CV symptoms. She denied any recent asthma exacerbations. She is alert and oriented. Very pleasant.  No conversational dyspnea. Heart RRR. I did not appreciate a murmur or carotid bruits. No ankle edema. Lung clear.    PROVIDERS: Ethelda Chick, MD is PCP [facility]; [phone]) Reside, Sherrine Maples, DMD is oral surgeon   LABS: Labs reviewed: Acceptable for surgery. (all labs ordered are listed, but only abnormal results are displayed)  Labs Reviewed  SURGICAL PCR SCREEN - Abnormal; Notable for the following components:      Result Value   Staphylococcus aureus POSITIVE (*)    All other components within normal limits  BASIC METABOLIC PANEL - Abnormal; Notable for the following components:   Glucose, Bld 106 (*)    All other components within normal limits  CBC  TYPE AND SCREEN     IMAGES: Xray right hip 09/12/22: Advanced degenerative joint disease with bone-on-bone joint space  narrowing.    EKG: EKG 11/29/22:  Normal sinus rhythm with sinus arrhythmia Moderate voltage criteria for LVH, may be normal variant ( R in aVL , Cornell product ) Borderline ECG  CV: Echo 12/17/19: IMPRESSIONS   1. Left ventricular ejection fraction, by estimation, is 55 to 60%. The  left ventricle has normal function. The left ventricle  has no regional  wall motion abnormalities. Left ventricular diastolic parameters are  consistent with Grade I diastolic  dysfunction (impaired relaxation).   2. Right ventricular systolic function is normal. The right ventricular  size is normal. Tricuspid regurgitation signal is inadequate for assessing  PA pressure.   3. The mitral valve is normal in structure. Unable to exclude mild  prolapse, both anterior and posterior leaflets. Mild to moderate mitral  valve regurgitation.    4. The aortic valve is grossly normal, though not well visualized. Aortic  valve regurgitation is mild.    Past Medical History:  Diagnosis Date   Allergy    Arthritis    Asthma    Blood transfusion without reported diagnosis    Cataract    Heart murmur    Hypertension    Seasonal allergies     Past Surgical History:  Procedure Laterality Date   ABDOMINAL HYSTERECTOMY     APPENDECTOMY     BIOPSY TONGUE  11/2022   CATARACT EXTRACTION     PTERYGIUM EXCISION Left 11/2022   TONSILLECTOMY      MEDICATIONS:  acetaminophen (TYLENOL) 325 MG tablet   albuterol (PROVENTIL HFA;VENTOLIN HFA) 108 (90 Base) MCG/ACT inhaler   atenolol (TENORMIN) 25 MG tablet   benzonatate (TESSALON) 100 MG capsule   celecoxib (CELEBREX) 100 MG capsule   chlorthalidone (HYGROTON) 25 MG tablet   Cyanocobalamin 1000 MCG CAPS   guaiFENesin-codeine 100-10 MG/5ML syrup   hydrochlorothiazide (MICROZIDE) 12.5 MG capsule   magnesium oxide (MAG-OX) 400 MG tablet   Misc Natural Products (OSTEO BI-FLEX JOINT SHIELD PO)   montelukast (SINGULAIR) 10 MG tablet   multivitamin-lutein (OCUVITE-LUTEIN) CAPS capsule   Omega-3 Fatty Acids (FISH OIL) 1000 MG CAPS   Polyethyl Glycol-Propyl Glycol 0.4-0.3 % SOLN   zinc gluconate 50 MG tablet   No current facility-administered medications for this encounter.    Shonna Chock, PA-C Surgical Short Stay/Anesthesiology [facility] Phone [phone] [facility] Phone [phone] 11/29/2022 9:44 PM

## 2022-11-29 NOTE — Progress Notes (Signed)
PCP - Ethelda Chick MD Cardiologist - Denies  PPM/ICD - Denis Device Orders - N/A Rep Notified - N/A  Chest x-ray - N/A EKG - 11-29-22 Stress Test -  N/A ECHO - 12-17-19 Cardiac Cath - N/A  Sleep Study - Denies CPAP - N/A  DM- Denies  Last dose of GLP1 agonist-  Denies  GLP1 instructions: N/A  Blood Thinner Instructions: N/A Aspirin Instructions: Denies  ERAS Protcol - Clear liquids till 0640 PRE-SURGERY Ensure or G2- Ensure given to patient  COVID TEST- N/A   Anesthesia review: Yes, Patient received medical and cardiac clearance from PCP. Shonna Chock PA assessed patient during visit.  Patient denies shortness of breath, fever, cough and chest pain at PAT appointment   All instructions explained to the patient, with a verbal understanding of the material. Patient agrees to go over the instructions while at home for a better understanding. Patient also instructed to self quarantine after being tested for COVID-19. The opportunity to ask questions was provided.

## 2022-11-29 NOTE — Anesthesia Preprocedure Evaluation (Addendum)
Anesthesia Evaluation  Patient identified by MRN, date of birth, ID band Patient awake    Reviewed: Allergy & Precautions, NPO status , Patient's Chart, lab work & pertinent test results  Airway Mallampati: II  TM Distance: >3 FB Neck ROM: Full    Dental no notable dental hx. (+) Missing, Dental Advisory Given,    Pulmonary asthma , former smoker   Pulmonary exam normal breath sounds clear to auscultation       Cardiovascular hypertension, Normal cardiovascular exam Rhythm:Regular Rate:Normal  2021 Echo 1. Left ventricular ejection fraction, by estimation, is 55 to 60%. The  left ventricle has normal function. The left ventricle has no regional  wall motion abnormalities. Left ventricular diastolic parameters are  consistent with Grade I diastolic  dysfunction (impaired relaxation).   2. Right ventricular systolic function is normal. The right ventricular  size is normal. Tricuspid regurgitation signal is inadequate for assessing  PA pressure.   3. The mitral valve is normal in structure. Unable to exclude mild  prolapse, both anterior and posterior leaflets. Mild to moderate mitral  valve regurgitation.   4. The aortic valve is grossly normal, though not well visualized. Aortic  valve regurgitation is mild.      Neuro/Psych negative neurological ROS  negative psych ROS   GI/Hepatic negative GI ROS, Neg liver ROS,,,  Endo/Other  negative endocrine ROS    Renal/GU negative Renal ROS     Musculoskeletal  (+) Arthritis , Osteoarthritis,    Abdominal   Peds  Hematology Lab Results      Component                Value               Date                      WBC                      5.9                 11/29/2022                HGB                      12.5                11/29/2022                HCT                      39.2                11/29/2022                    PLT                      264                  11/29/2022              Anesthesia Other Findings All; codeine Naproxen, PCN  Reproductive/Obstetrics                             Anesthesia Physical Anesthesia Plan  ASA: 3  Anesthesia Plan: Spinal   Post-op Pain Management: Minimal or no pain anticipated  and Ofirmev IV (intra-op)*   Induction:   PONV Risk Score and Plan: 3 and Treatment may vary due to age or medical condition and Ondansetron  Airway Management Planned: Nasal Cannula and Natural Airway  Additional Equipment: None  Intra-op Plan:   Post-operative Plan:   Informed Consent: I have reviewed the patients History and Physical, chart, labs and discussed the procedure including the risks, benefits and alternatives for the proposed anesthesia with the patient or authorized representative who has indicated his/her understanding and acceptance.     Dental advisory given  Plan Discussed with: CRNA  Anesthesia Plan Comments: (PAT note written by Shonna Chock, PA-C.  )       Anesthesia Quick Evaluation

## 2022-12-03 ENCOUNTER — Other Ambulatory Visit: Payer: Self-pay | Admitting: Physician Assistant

## 2022-12-03 ENCOUNTER — Telehealth: Payer: Self-pay | Admitting: Orthopaedic Surgery

## 2022-12-03 MED ORDER — DOCUSATE SODIUM 100 MG PO CAPS: 100.0000 mg | ORAL_CAPSULE | Freq: Every day | ORAL | 2 refills | Status: AC | PRN

## 2022-12-03 MED ORDER — METHOCARBAMOL 750 MG PO TABS: 750.0000 mg | ORAL_TABLET | Freq: Two times a day (BID) | ORAL | 2 refills | Status: DC | PRN

## 2022-12-03 MED ORDER — ASPIRIN 81 MG PO TBEC: 81.0000 mg | DELAYED_RELEASE_TABLET | Freq: Two times a day (BID) | ORAL | 0 refills | Status: DC | PRN

## 2022-12-03 MED ORDER — OXYCODONE-ACETAMINOPHEN 5-325 MG PO TABS: 1.0000 | ORAL_TABLET | Freq: Four times a day (QID) | ORAL | 0 refills | Status: DC | PRN

## 2022-12-03 MED ORDER — ONDANSETRON HCL 4 MG PO TABS: 4.0000 mg | ORAL_TABLET | Freq: Three times a day (TID) | ORAL | 0 refills | Status: AC | PRN

## 2022-12-03 NOTE — Telephone Encounter (Signed)
Patient is scheduled for right total hip 12/10/22 and was put on the schedule back in late April.   Patient left message on voice mail stating she is supposed to be scheduled for an MRI or Scan. Must the surgery be cancelled if this  is not done prior to procedure?  The reasoning for the scan is surgical planning.  Not sure when this patient will get an appointment.  Please advise.

## 2022-12-03 NOTE — Telephone Encounter (Signed)
Yes she has to have the CT scan prior to surgery.

## 2022-12-04 NOTE — Telephone Encounter (Signed)
Pt is scheudled for July 5

## 2022-12-07 ENCOUNTER — Ambulatory Visit
Admission: RE | Admit: 2022-12-07 | Discharge: 2022-12-07 | Disposition: A | Discharge status: Home / Self Care | Payer: Medicare HMO | Source: Ambulatory Visit | Attending: Orthopaedic Surgery | Admitting: Orthopaedic Surgery

## 2022-12-07 DIAGNOSIS — M1611 Unilateral primary osteoarthritis, right hip: Secondary | ICD-10-CM

## 2022-12-07 MED ORDER — TRANEXAMIC ACID 1000 MG/10ML IV SOLN
2000.0000 mg | INTRAVENOUS | Status: DC
  Filled 2022-12-07: qty 20

## 2022-12-10 ENCOUNTER — Other Ambulatory Visit: Payer: Self-pay

## 2022-12-10 ENCOUNTER — Ambulatory Visit (HOSPITAL_COMMUNITY): Payer: Medicare HMO | Admitting: Vascular Surgery

## 2022-12-10 ENCOUNTER — Observation Stay (HOSPITAL_COMMUNITY)
Admission: RE | Admit: 2022-12-10 | Discharge: 2022-12-12 | Disposition: A | Discharge status: Skilled Nursing Facility | Payer: Medicare HMO | Attending: Orthopaedic Surgery | Admitting: Orthopaedic Surgery

## 2022-12-10 ENCOUNTER — Encounter (HOSPITAL_COMMUNITY)
Admission: RE | Disposition: A | Discharge status: Skilled Nursing Facility | Payer: Self-pay | Source: Home / Self Care | Attending: Orthopaedic Surgery

## 2022-12-10 ENCOUNTER — Ambulatory Visit (HOSPITAL_COMMUNITY): Payer: Medicare HMO

## 2022-12-10 ENCOUNTER — Observation Stay (HOSPITAL_COMMUNITY): Payer: Medicare HMO

## 2022-12-10 ENCOUNTER — Encounter (HOSPITAL_COMMUNITY): Payer: Self-pay | Admitting: Orthopaedic Surgery

## 2022-12-10 ENCOUNTER — Ambulatory Visit (HOSPITAL_BASED_OUTPATIENT_CLINIC_OR_DEPARTMENT_OTHER): Payer: Medicare HMO | Admitting: Anesthesiology

## 2022-12-10 DIAGNOSIS — Z79899 Other long term (current) drug therapy: Secondary | ICD-10-CM | POA: Diagnosis not present

## 2022-12-10 DIAGNOSIS — J45909 Unspecified asthma, uncomplicated: Secondary | ICD-10-CM

## 2022-12-10 DIAGNOSIS — Z87891 Personal history of nicotine dependence: Secondary | ICD-10-CM | POA: Insufficient documentation

## 2022-12-10 DIAGNOSIS — M1611 Unilateral primary osteoarthritis, right hip: Secondary | ICD-10-CM

## 2022-12-10 DIAGNOSIS — I10 Essential (primary) hypertension: Secondary | ICD-10-CM | POA: Insufficient documentation

## 2022-12-10 DIAGNOSIS — Z7982 Long term (current) use of aspirin: Secondary | ICD-10-CM | POA: Diagnosis not present

## 2022-12-10 DIAGNOSIS — Z96641 Presence of right artificial hip joint: Secondary | ICD-10-CM

## 2022-12-10 HISTORY — PX: TOTAL HIP ARTHROPLASTY: SHX124

## 2022-12-10 HISTORY — DX: Family history of other specified conditions: Z84.89

## 2022-12-10 LAB — ABO/RH: ABO/RH(D): AB NEG

## 2022-12-10 SURGERY — ARTHROPLASTY, HIP, TOTAL, ANTERIOR APPROACH
Anesthesia: Spinal | Site: Hip | Laterality: Right

## 2022-12-10 MED ORDER — TRANEXAMIC ACID 1000 MG/10ML IV SOLN
INTRAVENOUS | Status: DC | PRN
  Administered 2022-12-10: 2000 mg via TOPICAL

## 2022-12-10 MED ORDER — OXYCODONE HCL ER 10 MG PO T12A
10.0000 mg | EXTENDED_RELEASE_TABLET | Freq: Two times a day (BID) | ORAL | Status: DC
  Administered 2022-12-10 – 2022-12-12 (×5): 10 mg via ORAL
  Filled 2022-12-10 (×5): qty 1

## 2022-12-10 MED ORDER — ACETAMINOPHEN 500 MG PO TABS
1000.0000 mg | ORAL_TABLET | Freq: Four times a day (QID) | ORAL | Status: AC
  Administered 2022-12-10 – 2022-12-11 (×2): 1000 mg via ORAL
  Filled 2022-12-10 (×3): qty 2

## 2022-12-10 MED ORDER — VANCOMYCIN HCL 1 G IV SOLR
INTRAVENOUS | Status: DC | PRN
  Administered 2022-12-10: 1000 mg via TOPICAL

## 2022-12-10 MED ORDER — ASPIRIN 81 MG PO CHEW
81.0000 mg | CHEWABLE_TABLET | Freq: Two times a day (BID) | ORAL | Status: DC
  Administered 2022-12-10 – 2022-12-12 (×4): 81 mg via ORAL
  Filled 2022-12-10 (×4): qty 1

## 2022-12-10 MED ORDER — ACETAMINOPHEN 10 MG/ML IV SOLN
INTRAVENOUS | Status: DC | PRN
  Administered 2022-12-10: 1000 mg via INTRAVENOUS

## 2022-12-10 MED ORDER — POVIDONE-IODINE 10 % EX SWAB
2.0000 | Freq: Once | CUTANEOUS | Status: AC
  Administered 2022-12-10: 2 via TOPICAL

## 2022-12-10 MED ORDER — PRONTOSAN WOUND IRRIGATION OPTIME
TOPICAL | Status: DC | PRN
  Administered 2022-12-10: 1000 mL via TOPICAL

## 2022-12-10 MED ORDER — ONDANSETRON HCL 4 MG/2ML IJ SOLN
INTRAMUSCULAR | Status: DC | PRN
  Administered 2022-12-10: 4 mg via INTRAVENOUS

## 2022-12-10 MED ORDER — ACETAMINOPHEN 10 MG/ML IV SOLN
INTRAVENOUS | Status: AC
  Filled 2022-12-10: qty 100

## 2022-12-10 MED ORDER — ACETAMINOPHEN 10 MG/ML IV SOLN: 1000.0000 mg | Freq: Once | INTRAVENOUS | Status: DC | PRN

## 2022-12-10 MED ORDER — TRANEXAMIC ACID-NACL 1000-0.7 MG/100ML-% IV SOLN
1000.0000 mg | INTRAVENOUS | Status: AC
  Administered 2022-12-10: 1000 mg via INTRAVENOUS
  Filled 2022-12-10: qty 100

## 2022-12-10 MED ORDER — FENTANYL CITRATE (PF) 100 MCG/2ML IJ SOLN
INTRAMUSCULAR | Status: AC
  Filled 2022-12-10: qty 2

## 2022-12-10 MED ORDER — DIPHENHYDRAMINE HCL 12.5 MG/5ML PO ELIX: 25.0000 mg | ORAL_SOLUTION | ORAL | Status: DC | PRN

## 2022-12-10 MED ORDER — VANCOMYCIN HCL 1000 MG IV SOLR
INTRAVENOUS | Status: AC
  Filled 2022-12-10: qty 20

## 2022-12-10 MED ORDER — BUPIVACAINE IN DEXTROSE 0.75-8.25 % IT SOLN
INTRATHECAL | Status: DC | PRN
  Administered 2022-12-10: 12 mg via INTRATHECAL

## 2022-12-10 MED ORDER — POLYETHYLENE GLYCOL 3350 17 G PO PACK
17.0000 g | PACK | Freq: Every day | ORAL | Status: DC
  Administered 2022-12-10 – 2022-12-12 (×3): 17 g via ORAL
  Filled 2022-12-10 (×3): qty 1

## 2022-12-10 MED ORDER — LACTATED RINGERS IV SOLN: INTRAVENOUS | Status: DC

## 2022-12-10 MED ORDER — FENTANYL CITRATE (PF) 250 MCG/5ML IJ SOLN
INTRAMUSCULAR | Status: DC | PRN
  Administered 2022-12-10: 50 ug via INTRAVENOUS

## 2022-12-10 MED ORDER — PHENOL 1.4 % MT LIQD: 1.0000 | OROMUCOSAL | Status: DC | PRN

## 2022-12-10 MED ORDER — ONDANSETRON HCL 4 MG/2ML IJ SOLN: 4.0000 mg | Freq: Once | INTRAMUSCULAR | Status: DC | PRN

## 2022-12-10 MED ORDER — ONDANSETRON HCL 4 MG/2ML IJ SOLN: 4.0000 mg | Freq: Four times a day (QID) | INTRAMUSCULAR | Status: DC | PRN

## 2022-12-10 MED ORDER — PROPOFOL 500 MG/50ML IV EMUL
INTRAVENOUS | Status: DC | PRN
  Administered 2022-12-10: 75 ug/kg/min via INTRAVENOUS

## 2022-12-10 MED ORDER — TRANEXAMIC ACID 1000 MG/10ML IV SOLN: 3000.0000 mg | Freq: Once | INTRAVENOUS | Status: DC

## 2022-12-10 MED ORDER — TRANEXAMIC ACID 1000 MG/10ML IV SOLN
2000.0000 mg | Freq: Once | INTRAVENOUS | Status: DC
  Filled 2022-12-10: qty 20

## 2022-12-10 MED ORDER — BUPIVACAINE-MELOXICAM ER 400-12 MG/14ML IJ SOLN
INTRAMUSCULAR | Status: DC | PRN
  Administered 2022-12-10: 400 mg

## 2022-12-10 MED ORDER — ALUM & MAG HYDROXIDE-SIMETH 200-200-20 MG/5ML PO SUSP: 30.0000 mL | ORAL | Status: DC | PRN

## 2022-12-10 MED ORDER — BUPIVACAINE-MELOXICAM ER 400-12 MG/14ML IJ SOLN
INTRAMUSCULAR | Status: AC
  Filled 2022-12-10: qty 1

## 2022-12-10 MED ORDER — PHENYLEPHRINE HCL-NACL 20-0.9 MG/250ML-% IV SOLN
INTRAVENOUS | Status: DC | PRN
  Administered 2022-12-10: 30 ug/min via INTRAVENOUS

## 2022-12-10 MED ORDER — ONDANSETRON HCL 4 MG PO TABS: 4.0000 mg | ORAL_TABLET | Freq: Four times a day (QID) | ORAL | Status: DC | PRN

## 2022-12-10 MED ORDER — METHOCARBAMOL 1000 MG/10ML IJ SOLN: 500.0000 mg | Freq: Four times a day (QID) | INTRAVENOUS | Status: DC | PRN

## 2022-12-10 MED ORDER — SODIUM CHLORIDE 0.9 % IR SOLN
Status: DC | PRN
  Administered 2022-12-10: 1000 mL

## 2022-12-10 MED ORDER — METOCLOPRAMIDE HCL 5 MG/ML IJ SOLN: 5.0000 mg | Freq: Three times a day (TID) | INTRAMUSCULAR | Status: DC | PRN

## 2022-12-10 MED ORDER — ONDANSETRON HCL 4 MG/2ML IJ SOLN
INTRAMUSCULAR | Status: AC
  Filled 2022-12-10: qty 2

## 2022-12-10 MED ORDER — CEFAZOLIN SODIUM-DEXTROSE 2-4 GM/100ML-% IV SOLN
2.0000 g | Freq: Four times a day (QID) | INTRAVENOUS | Status: AC
  Administered 2022-12-10 (×2): 2 g via INTRAVENOUS
  Filled 2022-12-10 (×2): qty 100

## 2022-12-10 MED ORDER — MAGNESIUM CITRATE PO SOLN: 1.0000 | Freq: Once | ORAL | Status: DC | PRN

## 2022-12-10 MED ORDER — PANTOPRAZOLE SODIUM 40 MG PO TBEC
40.0000 mg | DELAYED_RELEASE_TABLET | Freq: Every day | ORAL | Status: DC
  Administered 2022-12-10 – 2022-12-12 (×3): 40 mg via ORAL
  Filled 2022-12-10 (×3): qty 1

## 2022-12-10 MED ORDER — FENTANYL CITRATE (PF) 250 MCG/5ML IJ SOLN
INTRAMUSCULAR | Status: AC
  Filled 2022-12-10: qty 5

## 2022-12-10 MED ORDER — METHOCARBAMOL 500 MG PO TABS
500.0000 mg | ORAL_TABLET | Freq: Four times a day (QID) | ORAL | Status: DC | PRN
  Administered 2022-12-10 – 2022-12-11 (×2): 500 mg via ORAL
  Filled 2022-12-10 (×2): qty 1

## 2022-12-10 MED ORDER — CHLORHEXIDINE GLUCONATE 0.12 % MT SOLN
15.0000 mL | Freq: Once | OROMUCOSAL | Status: AC
  Administered 2022-12-10: 15 mL via OROMUCOSAL
  Filled 2022-12-10: qty 15

## 2022-12-10 MED ORDER — MAGNESIUM OXIDE -MG SUPPLEMENT 400 (240 MG) MG PO TABS
400.0000 mg | ORAL_TABLET | Freq: Every day | ORAL | Status: DC
  Administered 2022-12-10 – 2022-12-11 (×2): 400 mg via ORAL
  Filled 2022-12-10 (×2): qty 1

## 2022-12-10 MED ORDER — CEFAZOLIN SODIUM-DEXTROSE 2-4 GM/100ML-% IV SOLN
2.0000 g | INTRAVENOUS | Status: AC
  Administered 2022-12-10: 2 g via INTRAVENOUS
  Filled 2022-12-10: qty 100

## 2022-12-10 MED ORDER — MENTHOL 3 MG MT LOZG: 1.0000 | LOZENGE | OROMUCOSAL | Status: DC | PRN

## 2022-12-10 MED ORDER — OXYCODONE HCL 5 MG PO TABS
5.0000 mg | ORAL_TABLET | ORAL | Status: DC | PRN
  Administered 2022-12-11: 10 mg via ORAL
  Filled 2022-12-10: qty 2

## 2022-12-10 MED ORDER — FERROUS SULFATE 325 (65 FE) MG PO TABS
325.0000 mg | ORAL_TABLET | Freq: Three times a day (TID) | ORAL | Status: DC
  Administered 2022-12-10 – 2022-12-12 (×4): 325 mg via ORAL
  Filled 2022-12-10 (×5): qty 1

## 2022-12-10 MED ORDER — FENTANYL CITRATE (PF) 100 MCG/2ML IJ SOLN
25.0000 ug | INTRAMUSCULAR | Status: DC | PRN
  Administered 2022-12-10: 25 ug via INTRAVENOUS

## 2022-12-10 MED ORDER — ACETAMINOPHEN 325 MG PO TABS
325.0000 mg | ORAL_TABLET | Freq: Four times a day (QID) | ORAL | Status: DC | PRN
  Administered 2022-12-11: 500 mg via ORAL
  Administered 2022-12-12: 650 mg via ORAL
  Filled 2022-12-10: qty 2

## 2022-12-10 MED ORDER — METHOCARBAMOL 500 MG PO TABS
ORAL_TABLET | ORAL | Status: AC
  Administered 2022-12-10: 500 mg via ORAL
  Filled 2022-12-10: qty 1

## 2022-12-10 MED ORDER — HYDROMORPHONE HCL 1 MG/ML IJ SOLN
0.5000 mg | INTRAMUSCULAR | Status: DC | PRN
  Administered 2022-12-10: 1 mg via INTRAVENOUS
  Filled 2022-12-10: qty 1

## 2022-12-10 MED ORDER — ORAL CARE MOUTH RINSE: 15.0000 mL | Freq: Once | OROMUCOSAL | Status: AC

## 2022-12-10 MED ORDER — SODIUM CHLORIDE 0.9 % IV SOLN: INTRAVENOUS | Status: DC

## 2022-12-10 MED ORDER — OXYCODONE HCL 5 MG PO TABS: 10.0000 mg | ORAL_TABLET | ORAL | Status: DC | PRN

## 2022-12-10 MED ORDER — OXYCODONE HCL 5 MG PO TABS
ORAL_TABLET | ORAL | Status: AC
  Administered 2022-12-10: 5 mg via ORAL
  Filled 2022-12-10: qty 1

## 2022-12-10 MED ORDER — DOCUSATE SODIUM 100 MG PO CAPS
100.0000 mg | ORAL_CAPSULE | Freq: Two times a day (BID) | ORAL | Status: DC
  Administered 2022-12-10 – 2022-12-12 (×5): 100 mg via ORAL
  Filled 2022-12-10 (×5): qty 1

## 2022-12-10 MED ORDER — SORBITOL 70 % SOLN: 30.0000 mL | Freq: Every day | Status: DC | PRN

## 2022-12-10 MED ORDER — HYDROCHLOROTHIAZIDE 25 MG PO TABS
50.0000 mg | ORAL_TABLET | Freq: Every day | ORAL | Status: DC
  Administered 2022-12-10 – 2022-12-12 (×3): 50 mg via ORAL
  Filled 2022-12-10 (×3): qty 2

## 2022-12-10 MED ORDER — DEXAMETHASONE SODIUM PHOSPHATE 10 MG/ML IJ SOLN
10.0000 mg | Freq: Once | INTRAMUSCULAR | Status: AC
  Administered 2022-12-11: 10 mg via INTRAVENOUS
  Filled 2022-12-10: qty 1

## 2022-12-10 MED ORDER — 0.9 % SODIUM CHLORIDE (POUR BTL) OPTIME
TOPICAL | Status: DC | PRN
  Administered 2022-12-10: 1000 mL

## 2022-12-10 MED ORDER — METOCLOPRAMIDE HCL 5 MG PO TABS: 5.0000 mg | ORAL_TABLET | Freq: Three times a day (TID) | ORAL | Status: DC | PRN

## 2022-12-10 SURGICAL SUPPLY — 69 items
ADH SKN CLS APL DERMABOND .7 (GAUZE/BANDAGES/DRESSINGS) ×1
ARTICULEZE HEAD (Hips) ×1 IMPLANT
BAG COUNTER SPONGE SURGICOUNT (BAG) ×2 IMPLANT
BAG DECANTER FOR FLEXI CONT (MISCELLANEOUS) ×2 IMPLANT
BAG SPNG CNTER NS LX DISP (BAG) ×1
BALL HIP ARTICU EZE 36 8.5 (Hips) IMPLANT
BLADE SAG 18X100X1.27 (BLADE) ×2 IMPLANT
COVER PERINEAL POST (MISCELLANEOUS) ×2 IMPLANT
COVER SURGICAL LIGHT HANDLE (MISCELLANEOUS) ×2 IMPLANT
CUP ACETAB 52MM (Orthopedic Implant) IMPLANT
DERMABOND ADVANCED .7 DNX12 (GAUZE/BANDAGES/DRESSINGS) IMPLANT
DRAPE C-ARM 42X72 X-RAY (DRAPES) ×2 IMPLANT
DRAPE POUCH INSTRU U-SHP 10X18 (DRAPES) ×2 IMPLANT
DRAPE STERI IOBAN 125X83 (DRAPES) ×2 IMPLANT
DRAPE U-SHAPE 47X51 STRL (DRAPES) ×4 IMPLANT
DRSG AQUACEL AG ADV 3.5X 4 (GAUZE/BANDAGES/DRESSINGS) IMPLANT
DRSG AQUACEL AG ADV 3.5X10 (GAUZE/BANDAGES/DRESSINGS) ×2 IMPLANT
DURAPREP 26ML APPLICATOR (WOUND CARE) ×4 IMPLANT
ELECT BLADE 4.0 EZ CLEAN MEGAD (MISCELLANEOUS) ×1
ELECT REM PT RETURN 9FT ADLT (ELECTROSURGICAL) ×1
ELECTRODE BLDE 4.0 EZ CLN MEGD (MISCELLANEOUS) ×2 IMPLANT
ELECTRODE REM PT RTRN 9FT ADLT (ELECTROSURGICAL) ×2 IMPLANT
GLOVE BIOGEL PI IND STRL 7.0 (GLOVE) ×4 IMPLANT
GLOVE BIOGEL PI IND STRL 7.5 (GLOVE) ×10 IMPLANT
GLOVE ECLIPSE 7.0 STRL STRAW (GLOVE) ×4 IMPLANT
GLOVE SKINSENSE STRL SZ7.5 (GLOVE) ×2 IMPLANT
GLOVE SURG SYN 7.5 E (GLOVE) ×2 IMPLANT
GLOVE SURG SYN 7.5 PF PI (GLOVE) ×4 IMPLANT
GLOVE SURG UNDER POLY LF SZ7 (GLOVE) ×6 IMPLANT
GLOVE SURG UNDER POLY LF SZ7.5 (GLOVE) ×4 IMPLANT
GOWN STRL REUS W/ TWL LRG LVL3 (GOWN DISPOSABLE) IMPLANT
GOWN STRL REUS W/ TWL XL LVL3 (GOWN DISPOSABLE) ×2 IMPLANT
GOWN STRL REUS W/TWL LRG LVL3 (GOWN DISPOSABLE)
GOWN STRL REUS W/TWL XL LVL3 (GOWN DISPOSABLE) ×1
GOWN STRL SURGICAL XL XLNG (GOWN DISPOSABLE) ×2 IMPLANT
GOWN TOGA ZIPPER T7+ PEEL AWAY (MISCELLANEOUS) ×4 IMPLANT
HANDPIECE INTERPULSE COAX TIP (DISPOSABLE) ×1
HEAD ARTICULEZE (Hips) IMPLANT
HIP BALL ARTICU EZE 36 8.5 (Hips) IMPLANT
HOOD PEEL AWAY T7 (MISCELLANEOUS) ×2 IMPLANT
IV NS IRRIG 3000ML ARTHROMATIC (IV SOLUTION) ×2 IMPLANT
KIT BASIN OR (CUSTOM PROCEDURE TRAY) ×2 IMPLANT
LINER NEUTRAL 52X36MM PLUS 4 (Liner) IMPLANT
MARKER SKIN DUAL TIP RULER LAB (MISCELLANEOUS) ×2 IMPLANT
NDL SPNL 18GX3.5 QUINCKE PK (NEEDLE) ×2 IMPLANT
NEEDLE SPNL 18GX3.5 QUINCKE PK (NEEDLE) ×1 IMPLANT
PACK TOTAL JOINT (CUSTOM PROCEDURE TRAY) ×2 IMPLANT
PACK UNIVERSAL I (CUSTOM PROCEDURE TRAY) ×2 IMPLANT
SCREW 6.5MMX25MM (Screw) IMPLANT
SCREW PINN CAN 6.5X20 (Screw) IMPLANT
SET HNDPC FAN SPRY TIP SCT (DISPOSABLE) ×2 IMPLANT
SOLUTION PRONTOSAN WOUND 350ML (IRRIGATION / IRRIGATOR) ×2 IMPLANT
STAPLER VISISTAT 35W (STAPLE) IMPLANT
STEM FEMORAL SZ5 HIGH ACTIS (Stem) IMPLANT
SUT ETHIBOND 2 V 37 (SUTURE) ×2 IMPLANT
SUT ETHILON 2 0 PSLX (SUTURE) IMPLANT
SUT VIC AB 0 CT1 27 (SUTURE) ×1
SUT VIC AB 0 CT1 27XBRD ANBCTR (SUTURE) ×2 IMPLANT
SUT VIC AB 1 CTX 36 (SUTURE) ×1
SUT VIC AB 1 CTX36XBRD ANBCTR (SUTURE) ×2 IMPLANT
SUT VIC AB 2-0 CT1 27 (SUTURE) ×3
SUT VIC AB 2-0 CT1 TAPERPNT 27 (SUTURE) ×4 IMPLANT
SYR 50ML LL SCALE MARK (SYRINGE) ×2 IMPLANT
TOWEL GREEN STERILE (TOWEL DISPOSABLE) ×2 IMPLANT
TRAY CATH INTERMITTENT SS 16FR (CATHETERS) IMPLANT
TRAY FOLEY W/BAG SLVR 16FR (SET/KITS/TRAYS/PACK)
TRAY FOLEY W/BAG SLVR 16FR ST (SET/KITS/TRAYS/PACK) IMPLANT
TUBE SUCT ARGYLE STRL (TUBING) ×2 IMPLANT
YANKAUER SUCT BULB TIP NO VENT (SUCTIONS) ×2 IMPLANT

## 2022-12-10 NOTE — Progress Notes (Signed)
    Durable Medical Equipment  (From admission, onward)           Start     Ordered   12/10/22 1540  DME Bedside commode  Once       Comments: Confine to one room  Question:  Patient needs a bedside commode to treat with the following condition  Answer:  History of hip replacement   12/10/22 1540   12/10/22 1341  DME Walker rolling  Once       Question:  Patient needs a walker to treat with the following condition  Answer:  History of hip replacement   12/10/22 1340   12/10/22 1341  DME 3 n 1  Once        12/10/22 1340

## 2022-12-10 NOTE — H&P (Signed)
PREOPERATIVE H&P  Chief Complaint: right hip osteoarthritis  HPI: Elizabeth Branch is a 77 y.o. female who presents for surgical treatment of right hip osteoarthritis.  She denies any changes in medical history.  Past Medical History:  Diagnosis Date   Allergy    Arthritis    Asthma    Blood transfusion without reported diagnosis    Cataract    Heart murmur    Hypertension    Seasonal allergies    Past Surgical History:  Procedure Laterality Date   ABDOMINAL HYSTERECTOMY     APPENDECTOMY     BIOPSY TONGUE  11/2022   CATARACT EXTRACTION     PTERYGIUM EXCISION Left 11/2022   TONSILLECTOMY     Social History   Socioeconomic History   Marital status: Widowed    Spouse name: Not on file   Number of children: Not on file   Years of education: Not on file   Highest education level: Not on file  Occupational History   Not on file  Tobacco Use   Smoking status: Former    Types: Cigarettes    Quit date: 10    Years since quitting: 40.5   Smokeless tobacco: Never  Substance and Sexual Activity   Alcohol use: Yes    Comment: occassional   Drug use: No   Sexual activity: Not on file  Other Topics Concern   Not on file  Social History Narrative   Not on file   Social Determinants of Health   Financial Resource Strain: Not on file  Food Insecurity: Not on file  Transportation Needs: Not on file  Physical Activity: Not on file  Stress: Not on file  Social Connections: Not on file   Family History  Problem Relation Age of Onset   Colon cancer Cousin    Breast cancer Maternal Aunt    Allergies  Allergen Reactions   Codeine Nausea And Vomiting   Naproxen Nausea And Vomiting   Penicillins     Has patient had a PCN reaction causing immediate rash, facial/tongue/throat swelling, SOB or lightheadedness with hypotension: Yes   Egg-Derived Products Swelling and Rash   Pineapple Rash   Prior to Admission medications   Medication Sig Start Date End Date  Taking? Authorizing Provider  acetaminophen (TYLENOL) 325 MG tablet Take 500 mg by mouth every 6 (six) hours as needed for mild pain or moderate pain.   Yes [provider]  albuterol (PROVENTIL HFA;VENTOLIN HFA) 108 (90 Base) MCG/ACT inhaler Inhale 1 puff into the lungs every 6 (six) hours as needed for wheezing or shortness of breath.   Yes [provider]  aspirin EC 81 MG tablet Take 1 tablet (81 mg total) by mouth 2 (two) times daily as needed. To be taken after surgery to prevent blood clots 12/03/22 12/03/23  [name], PA-C  bismuth subsalicylate (PEPTO BISMOL) 262 MG chewable tablet Chew 524 mg by mouth as needed for indigestion or diarrhea or loose stools.   Yes [provider]  celecoxib (CELEBREX) 100 MG capsule Take 200 mg by mouth daily.   Yes [provider]  docusate sodium (COLACE) 100 MG capsule Take 1 capsule (100 mg total) by mouth daily as needed. 12/03/22 12/03/23  [name], PA-C  Glucosamine-Chondroitin 750-600 MG TABS Take 1 tablet by mouth daily.   Yes [provider]  hydrochlorothiazide (HYDRODIURIL) 25 MG tablet Take 50 mg by mouth daily.   Yes [provider]  magnesium oxide (  MAG-OX) 400 MG tablet Take 400 mg by mouth at bedtime.   Yes [provider]  methocarbamol (ROBAXIN-750) 750 MG tablet Take 1 tablet (750 mg total) by mouth 2 (two) times daily as needed for muscle spasms. 12/03/22   [name], PA-C  montelukast (SINGULAIR) 10 MG tablet Take 10 mg by mouth at bedtime.   Yes [provider]  ondansetron (ZOFRAN) 4 MG tablet Take 1 tablet (4 mg total) by mouth every 8 (eight) hours as needed for nausea or vomiting. 12/03/22   [name], PA-C  oxyCODONE-acetaminophen (PERCOCET) 5-325 MG tablet Take 1-2 tablets by mouth every 6 (six) hours as needed. To  be taken after surgery 12/03/22   [name], PA-C  Polyethyl Glycol-Propyl Glycol 0.4-0.3 % SOLN Place 1 drop into both eyes  as needed (itching eyes).   Yes [provider]  Prenatal Vit-Fe Fumarate-FA (PRENATAL PO) Take 1 tablet by mouth daily.   Yes [provider]  PRESCRIPTION MEDICATION Apply 1 Application topically as needed (dry skin/eczema). Unknown cream   Yes [provider]  Turmeric 450 MG CAPS Take 450 mg by mouth daily.   Yes [provider]  Vitamins-Lipotropics (LIPO FLAVONOID PLUS PO) Take 2-3 tablets by mouth daily.   Yes [provider]  White Petrolatum-Mineral Oil (EYE LUBRICANT) OINT Place 1 Application into both eyes as needed (itching).   Yes [provider]  zinc gluconate 50 MG tablet Take 50 mg by mouth at bedtime.   Yes [provider]     Positive ROS: All other systems have been reviewed and were otherwise negative with the exception of those mentioned in the HPI and as above.  Physical Exam: General: Alert, no acute distress Cardiovascular: No pedal edema Respiratory: No cyanosis, no use of accessory musculature GI: abdomen soft Skin: No lesions in the area of chief complaint Neurologic: Sensation intact distally Psychiatric: Patient is competent for consent with normal mood and affect Lymphatic: no lymphedema  MUSCULOSKELETAL: exam stable  Assessment: right hip osteoarthritis  Plan: Plan for Procedure(s): RIGHT TOTAL HIP ARTHROPLASTY ANTERIOR APPROACH  The risks benefits and alternatives were discussed with the patient including but not limited to the risks of nonoperative treatment, versus surgical intervention including infection, bleeding, nerve injury,  blood clots, cardiopulmonary complications, morbidity, mortality, among others, and they were willing to proceed.   Glee Arvin, MD 12/10/2022 7:06 AM

## 2022-12-10 NOTE — Anesthesia Procedure Notes (Signed)
Procedure Name: MAC Date/Time: 12/10/2022 9:50 AM  Performed by: Nils Pyle, CRNAPre-anesthesia Checklist: Patient identified, Emergency Drugs available, Suction available and Patient being monitored Patient Re-evaluated:Patient Re-evaluated prior to induction Oxygen Delivery Method: Simple face mask Preoxygenation: Pre-oxygenation with 100% oxygen Induction Type: IV induction Placement Confirmation: positive ETCO2 and breath sounds checked- equal and bilateral Dental Injury: Teeth and Oropharynx as per pre-operative assessment

## 2022-12-10 NOTE — Plan of Care (Signed)
?  Problem: Education: ?Goal: Knowledge of the prescribed therapeutic regimen will improve ?Outcome: Progressing ?  ?Problem: Activity: ?Goal: Ability to avoid complications of mobility impairment will improve ?Outcome: Progressing ?  ?Problem: Pain Management: ?Goal: Pain level will decrease with appropriate interventions ?Outcome: Progressing ?  ?Problem: Skin Integrity: ?Goal: Will show signs of wound healing ?Outcome: Progressing ?  ?

## 2022-12-10 NOTE — Discharge Instructions (Signed)

## 2022-12-10 NOTE — Transfer of Care (Signed)
Immediate Anesthesia Transfer of Care Note  Patient: Elizabeth Branch  Procedure(s) Performed: RIGHT TOTAL HIP ARTHROPLASTY ANTERIOR APPROACH (Right: Hip)  Patient Location: PACU  Anesthesia Type:MAC and Spinal  Level of Consciousness: awake, alert , and oriented  Airway & Oxygen Therapy: Patient Spontanous Breathing  Post-op Assessment: Report given to RN and Post -op Vital signs reviewed and stable  Post vital signs: Reviewed and stable  Last Vitals:  Vitals Value Taken Time  BP 124/57 12/10/22 1156  Temp    Pulse 55 12/10/22 1157  Resp 15 12/10/22 1157  SpO2 99 % 12/10/22 1157  Vitals shown include unvalidated device data.  Last Pain:  Vitals:   12/10/22 0743  TempSrc:   PainSc: 0-No pain      Patients Stated Pain Goal: 0 (12/10/22 0743)  Complications: No notable events documented.

## 2022-12-10 NOTE — Anesthesia Procedure Notes (Addendum)
Spinal  Patient location during procedure: OR Start time: 12/10/2022 9:36 AM End time: 12/10/2022 9:43 AM Reason for block: surgical anesthesia Staffing Performed: anesthesiologist  Anesthesiologist: Trevor Iha, MD Performed by: Trevor Iha, MD Authorized by: Trevor Iha, MD   Preanesthetic Checklist Completed: patient identified, IV checked, risks and benefits discussed, surgical consent, monitors and equipment checked, pre-op evaluation and timeout performed Spinal Block Patient position: sitting Prep: DuraPrep and site prepped and draped Patient monitoring: heart rate, cardiac monitor, continuous pulse ox and blood pressure Approach: midline Location: L2-3 Injection technique: single-shot Needle Needle type: Pencan  Needle gauge: 24 G Needle length: 10 cm Needle insertion depth: 6 cm Assessment Sensory level: T4 Events: CSF return Additional Notes  1 Attempt (s). Pt tolerated procedure well.

## 2022-12-10 NOTE — Op Note (Signed)
RIGHT TOTAL HIP ARTHROPLASTY ANTERIOR APPROACH  Procedure Note Elizabeth Branch   [id number]  Pre-op Diagnosis: right hip osteoarthritis     Post-op Diagnosis: same  Operative Findings Very severe DJD of the right hip joint with extensive osteophytosis, collapsed and remodeled femoral head Leg length discrepancy Calcified hip external rotators   Operative Procedures  1. Total hip replacement; Right hip; uncemented [id number]   Surgeon: Gershon Mussel, M.D.  Assist: Oneal Grout, PA-C   Anesthesia: spinal  Prosthesis: Depuy Acetabulum: Pinnacle 52 mm multi-hole cup Femur: Actis 5 HO Head: 36 mm size: +5 Liner: +4 Bearing Type: metal/poly  Total Hip Arthroplasty (Anterior Approach) Op Note:  After informed consent was obtained and the operative extremity marked in the holding area, the patient was brought back to the operating room and placed supine on the HANA table. Next, the operative extremity was prepped and draped in normal sterile fashion. Surgical timeout occurred verifying patient identification, surgical site, surgical procedure and administration of antibiotics.  A longitudinal incision was made.  A Hueter approach to the hip was performed, using the interval between tensor fascia lata and sartorius.  There was extensive scarring of the muscular interval from the previous surgery.  Dissection was carried bluntly down onto the anterior hip capsule. The lateral femoral circumflex vessels were identified and coagulated. A capsulectomy was performed.  The neck osteotomy was performed. The femoral head was removed which showed severe wear and remodeling, the acetabular rim was cleared of soft tissue and osteophytes and attention was turned to reaming the acetabulum.  The osteophytes were carefully removed by first drilling a line of holes rim of the acetabulum and then connecting the holes with an osteotome so that the the osteotome did not a propagate a fracture into the  acetabulum itself.   Sequential reaming was performed under fluoroscopic guidance down to the floor of the cotyloid fossa.  Given the wear pattern of the acetabulum, I reamed slightly more medially in order to find a good bony vault.  I was very happy with the quality of the bone that I found.  We reamed to a size 51 mm, and then impacted the acetabular shell. A 25 and 20 mm cancellous screws were placed for added fixation of the cup.  The liner was then placed after irrigation and attention turned to the femur.  After placing the femoral hook, the leg was taken to externally rotated, extended and adducted position taking care to perform soft tissue releases to allow for adequate mobilization of the femur. Soft tissue was cleared from the shoulder of the greater trochanter and the hook elevator used to improve exposure of the proximal femur. Sequential broaching performed up to a size 5. Trial neck and head were placed.  +5 head and high offset best restored her native anatomy and leg length.  The leg was brought back up to neutral and the construct reduced.  Antibiotic irrigation was placed in the surgical wound.  The position and sizing of components, offset and leg lengths were checked using fluoroscopy. Stability of the construct was checked in extension and external rotation without any subluxation, shuck or impingement of prosthesis. We dislocated the prosthesis, dropped the leg back into position, removed trial components, and irrigated copiously. The final stem and head was then placed, the leg brought back up, the system reduced and fluoroscopy used to verify positioning.  We irrigated, obtained hemostasis and closed the capsule using #2 ethibond suture.  One gram of vancomycin powder  was placed in the surgical bed.   One gram of topical tranexamic acid was injected into the joint.  The fascia was closed with #1 vicryl plus, the deep fat layer was closed with 0 vicryl, the subcutaneous layers closed with  2.0 Vicryl Plus and the skin closed with 2.0 nylon and dermabond.  A medium HVAC was placed in the subcutaneous space.  A sterile dressing was applied. The patient was awakened in the operating room and taken to recovery in stable condition.  All sponge, needle, and instrument counts were correct at the end of the case.   Tessa Lerner, my PA, was a medical necessity for opening, closing, limb positioning, retracting, exposing, and overall facilitation and timely completion of the surgery.  Position: supine  Complications: see description of procedure.  Time Out: performed   Drains/Packing: medium HVAC in subcutaneous space  Estimated blood loss: see anesthesia record  Returned to Recovery Room: in good condition.   Antibiotics: yes   Mechanical VTE (DVT) Prophylaxis: sequential compression devices, TED thigh-high  Chemical VTE (DVT) Prophylaxis: aspirin   Fluid Replacement: see anesthesia record  Specimens Removed: 1 to pathology   Sponge and Instrument Count Correct? yes   PACU: portable radiograph - low AP   Plan/RTC: Return in 2 weeks for staple removal. Weight Bearing/Load Lower Extremity: full  Hip precautions: none Suture Removal: 2 weeks   N. Glee Arvin, MD [facility] 11:22 AM   Implant Name Type Inv. Item Serial No. Manufacturer Lot No. LRB No. Used Action  CUP ACETAB - [id number] Orthopedic Implant CUP ACETAB  [employer]PU[employer] [id number] Right 1 Implanted  SCREW PINN CAN 6.5X20 - [id number] Screw SCREW PINN CAN 6.5X20  [employer]PU[employer] [id number] Right 1 Implanted  LINER NEUTRAL 52X36MM PLUS 4 - [id number] Liner LINER NEUTRAL 52X36MM PLUS 4  DEPU[employer] [id number] Right 1 Implanted  SCREW 6.5MMX25MM - [id number] Screw SCREW 6.5MMX25MM  DEPU[employer] [id number] Right 1 Implanted  HIP BALL ARTICU EZE 36 8.5 - [id number] Hips HIP BALL ARTICU EZE 36 8.5  DEPU[employer]TH[employer] [id number] Right 1 Wasted  ARTICULEZE HEAD - [id number] Hips  ARTICULEZE HEAD  DEPUY ORTHOPA[employer] [id number] Right 1 Implanted  STEM FEMORAL SZ5 HIGH ACTIS - [id number] Stem STEM FEMORAL SZ5 HIGH ACTIS  DEPU[employer]TH[employer] [id number] Right 1 Implanted

## 2022-12-10 NOTE — Plan of Care (Signed)
  Problem: Education: Goal: Knowledge of the prescribed therapeutic regimen will improve Outcome: Progressing   Problem: Education: Goal: Knowledge of the prescribed therapeutic regimen will improve Outcome: Progressing Goal: Understanding of discharge needs will improve Outcome: Progressing Goal: Individualized Educational Video(s) Outcome: Progressing   Problem: Activity: Goal: Ability to avoid complications of mobility impairment will improve Outcome: Progressing Goal: Ability to tolerate increased activity will improve Outcome: Progressing   Problem: Clinical Measurements: Goal: Postoperative complications will be avoided or minimized Outcome: Progressing   Problem: Pain Management: Goal: Pain level will decrease with appropriate interventions Outcome: Progressing   Problem: Skin Integrity: Goal: Will show signs of wound healing Outcome: Progressing   Problem: Education: Goal: Knowledge of General Education information will improve Description: Including pain rating scale, medication(s)/side effects and non-pharmacologic comfort measures Outcome: Progressing   Problem: Health Behavior/Discharge Planning: Goal: Ability to manage health-related needs will improve Outcome: Progressing   Problem: Clinical Measurements: Goal: Ability to maintain clinical measurements within normal limits will improve Outcome: Progressing Goal: Will remain free from infection Outcome: Progressing Goal: Diagnostic test results will improve Outcome: Progressing Goal: Respiratory complications will improve Outcome: Progressing Goal: Cardiovascular complication will be avoided Outcome: Progressing   Problem: Activity: Goal: Risk for activity intolerance will decrease Outcome: Progressing   Problem: Nutrition: Goal: Adequate nutrition will be maintained Outcome: Progressing   Problem: Coping: Goal: Level of anxiety will decrease Outcome: Progressing   Problem:  Elimination: Goal: Will not experience complications related to bowel motility Outcome: Progressing Goal: Will not experience complications related to urinary retention Outcome: Progressing   Problem: Pain Managment: Goal: General experience of comfort will improve Outcome: Progressing   Problem: Safety: Goal: Ability to remain free from injury will improve Outcome: Progressing   Problem: Skin Integrity: Goal: Risk for impaired skin integrity will decrease Outcome: Progressing

## 2022-12-10 NOTE — Evaluation (Signed)
Physical Therapy Evaluation Patient Details Name: Elizabeth Branch MRN: [id number] DOB: 1946-01-18 Today's Date: 12/10/2022  History of Present Illness  77 y.o. female presents to [facility] hospital on 12/10/2022 for elective R THA. PMH includes asthma, cataract, heart murmur, HTN.  Clinical Impression  Pt presents to PT with deficits in functional mobility, gait, balance, strength, power, endurance. Pt is able to ambulate for short distances but requires assistance for bed mobility and transfers at this time. Pt expresses concerns about managing at home upon discharge, as she reports very infrequent caregiver support. Pt will follow up tomorrow for further gait and mobility training.        Assistance Recommended at Discharge Intermittent Supervision/Assistance  If plan is discharge home, recommend the following:  Can travel by private vehicle  A little help with walking and/or transfers;A lot of help with bathing/dressing/bathroom;Assistance with cooking/housework;Assist for transportation;Help with stairs or ramp for entrance        Equipment Recommendations Rolling walker (2 wheels);BSC/3in1  Recommendations for Other Services       Functional Status Assessment Patient has had a recent decline in their functional status and demonstrates the ability to make significant improvements in function in a reasonable and predictable amount of time.     Precautions / Restrictions Precautions Precautions: Fall Precaution Comments: direct anterior THA Restrictions Weight Bearing Restrictions: Yes RLE Weight Bearing: Weight bearing as tolerated      Mobility  Bed Mobility Overal bed mobility: Needs Assistance Bed Mobility: Supine to Sit     Supine to sit: Min assist, HOB elevated          Transfers Overall transfer level: Needs assistance Equipment used: Rolling walker (2 wheels) Transfers: Sit to/from Stand Sit to Stand: Min assist                 Ambulation/Gait Ambulation/Gait assistance: Min guard Gait Distance (Feet): 20 Feet Assistive device: Rolling walker (2 wheels) Gait Pattern/deviations: Step-to pattern Gait velocity: reduced Gait velocity interpretation: <1.31 ft/sec, indicative of household ambulator   General Gait Details: slowed step-to gait, reduced step lenght bilaterally  Stairs            Wheelchair Mobility     Tilt Bed    Modified Rankin (Stroke Patients Only)       Balance Overall balance assessment: Needs assistance Sitting-balance support: No upper extremity supported, Feet supported Sitting balance-Leahy Scale: Fair     Standing balance support: Bilateral upper extremity supported, Reliant on assistive device for balance Standing balance-Leahy Scale: Poor                               Pertinent Vitals/Pain Pain Assessment Pain Assessment: 0-10 Pain Score: 5  Pain Location: R hip Pain Descriptors / Indicators: Sore Pain Intervention(s): Monitored during session    Home Living Family/patient expects to be discharged to:: Private residence Living Arrangements: Alone Available Help at Discharge: Family;Available PRN/intermittently Type of Home: House Home Access: Stairs to enter Entrance Stairs-Rails: None Entrance Stairs-Number of Steps: 2   Home Layout: Multi-level;Able to live on main level with bedroom/bathroom Home Equipment: Gilmer Mor - single point;Shower seat      Prior Function Prior Level of Function : Independent/Modified Independent;Driving             Mobility Comments: ambulatory without DME       Hand Dominance        Extremity/Trunk Assessment   Upper Extremity Assessment Upper  Extremity Assessment: Overall WFL for tasks assessed    Lower Extremity Assessment Lower Extremity Assessment: RLE deficits/detail RLE Deficits / Details: generalized post-op weakness RLE Sensation: decreased light touch    Cervical / Trunk  Assessment Cervical / Trunk Assessment: Normal  Communication   Communication: No difficulties  Cognition Arousal/Alertness: Awake/alert Behavior During Therapy: WFL for tasks assessed/performed Overall Cognitive Status: Within Functional Limits for tasks assessed                                          General Comments General comments (skin integrity, edema, etc.): VSS on RA, hemovac drain in place    Exercises Other Exercises Other Exercises: PT provides education on THA exercise packet   Assessment/Plan    PT Assessment Patient needs continued PT services  PT Problem List Decreased strength;Decreased activity tolerance;Decreased balance;Decreased mobility;Decreased knowledge of use of DME;Pain       PT Treatment Interventions Gait training;Stair training;DME instruction;Therapeutic activities;Functional mobility training;Therapeutic exercise;Balance training;Neuromuscular re-education;Patient/family education    PT Goals (Current goals can be found in the Care Plan section)  Acute Rehab PT Goals Patient Stated Goal: to go to short term rehab to return to independence PT Goal Formulation: With patient Time For Goal Achievement: 12/14/22 Potential to Achieve Goals: Fair    Frequency 7X/week     Co-evaluation               AM-PAC PT "6 Clicks" Mobility  Outcome Measure Help needed turning from your back to your side while in a flat bed without using bedrails?: A Little Help needed moving from lying on your back to sitting on the side of a flat bed without using bedrails?: A Little Help needed moving to and from a bed to a chair (including a wheelchair)?: A Little Help needed standing up from a chair using your arms (e.g., wheelchair or bedside chair)?: A Little Help needed to walk in hospital room?: A Little Help needed climbing 3-5 steps with a railing? : Total 6 Click Score: 16    End of Session Equipment Utilized During Treatment: Gait  belt Activity Tolerance: Patient tolerated treatment well Patient left: in chair;with call bell/phone within reach;with family/visitor present Nurse Communication: Mobility status PT Visit Diagnosis: Other abnormalities of gait and mobility (R26.89);Muscle weakness (generalized) (M62.81);Pain Pain - Right/Left: Right Pain - part of body: Hip    Time: 1610-9604 PT Time Calculation (min) (ACUTE ONLY): 29 min   Charges:   PT Evaluation $PT Eval Low Complexity: 1 Low   PT General Charges $$ ACUTE PT VISIT: 1 Visit         Arlyss Gandy, PT, DPT Acute Rehabilitation Office [phone]   Arlyss Gandy 12/10/2022, 5:20 PM

## 2022-12-11 DIAGNOSIS — M1611 Unilateral primary osteoarthritis, right hip: Secondary | ICD-10-CM | POA: Diagnosis not present

## 2022-12-11 LAB — CBC
HCT: 29.1 % — ABNORMAL LOW (ref 36.0–46.0)
Hemoglobin: 9.7 g/dL — ABNORMAL LOW (ref 12.0–15.0)
MCH: 30.2 pg (ref 26.0–34.0)
MCHC: 33.3 g/dL (ref 30.0–36.0)
MCV: 90.7 fL (ref 80.0–100.0)
Platelets: 198 10*3/uL (ref 150–400)
RBC: 3.21 MIL/uL — ABNORMAL LOW (ref 3.87–5.11)
RDW: 12.5 % (ref 11.5–15.5)
WBC: 10.4 10*3/uL (ref 4.0–10.5)
nRBC: 0 % (ref 0.0–0.2)

## 2022-12-11 MED ORDER — OXYCODONE-ACETAMINOPHEN 5-325 MG PO TABS: 1.0000 | ORAL_TABLET | Freq: Four times a day (QID) | ORAL | 0 refills | Status: AC | PRN

## 2022-12-11 MED ORDER — ASPIRIN 81 MG PO TBEC: 81.0000 mg | DELAYED_RELEASE_TABLET | Freq: Two times a day (BID) | ORAL | 0 refills | Status: AC | PRN

## 2022-12-11 NOTE — TOC Initial Note (Signed)
Transition of Care [facility]) - Initial/Assessment Note    Patient Details  Name: Elizabeth Branch MRN: [id number] Date of Birth: Jan 30, 1946  Transition of Care [facility]) CM/SW Contact:    Epifanio Lesches, RN Phone Number: 12/11/2022, 2:24 PM  Clinical Narrative:                 - s/p R THA., 7/8 RNCM received consult for possible SNF placement at time of discharge. RNCM spoke with patient and son @ bedside. Pt  gave permission for NCM to speak in front of son.Pt states she lives alone. States she is currently unable to care for self independently  at home given her current physical needs and fall risk. Pt states has supportive son. However, son works and will not be available to assist with care. Patient states she  is agreeable to SNF placement at time of discharge. Patient reports preference for [facility] Rehab/SNF. RNCM discussed insurance authorization process and provided Medicare SNF ratings list. Patient expressed being hopeful for rehab and to feel better soon. No further questions reported at this time. RNCM to continue to follow and assist with discharge planning needs.   Expected Discharge Plan: Skilled Nursing Facility Barriers to Discharge: Continued Medical Work up   Patient Goals and CMS Choice            Expected Discharge Plan and Services   Discharge Planning Services: CM Consult   Living arrangements for the past 2 months: Single Family Home                 DME Arranged: Walker rolling, Bedside commode DME Agency: [employer] Date DME Agency Contacted: 12/10/22 Time DME Agency Contacted: [phone] Representative spoke with at DME Agency: DOLONDA HH Arranged: PT HH Agency: [facility] Date [facility] Agency Contacted: 12/10/22 Time HH Agency Contacted: 1532    Prior Living Arrangements/Services Living arrangements for the past 2 months: Single Family Home Lives with:: Self Patient language and need for interpreter reviewed:: Yes Do you feel safe going back to  the place where you live?: Yes      Need for Family Participation in Patient Care: Yes (Comment) Care giver support system in place?: No (comment) Current home services: DME (canes x 2) Criminal Activity/Legal Involvement Pertinent to Current Situation/Hospitalization: No - Comment as needed  Activities of Daily Living Home Assistive Devices/Equipment: Eyeglasses, Cane (specify quad or straight) ADL Screening (condition at time of admission) Patient's cognitive ability adequate to safely complete daily activities?: Yes Is the patient deaf or have difficulty hearing?: No Does the patient have difficulty seeing, even when wearing glasses/contacts?: No Does the patient have difficulty concentrating, remembering, or making decisions?: No Patient able to express need for assistance with ADLs?: Yes Does the patient have difficulty dressing or bathing?: No Independently performs ADLs?: Yes (appropriate for developmental age) Does the patient have difficulty walking or climbing stairs?: Yes Weakness of Legs: Right Weakness of Arms/Hands: None  Permission Sought/Granted                  Emotional Assessment Appearance:: Appears stated age Attitude/Demeanor/Rapport: Gracious Affect (typically observed): Accepting Orientation: : Oriented to Self, Oriented to Place, Oriented to  Time, Oriented to Situation Alcohol / Substance Use: Not Applicable Psych Involvement: No (comment)  Admission diagnosis:  Status post total replacement of right hip [Z96.641] Patient Active Problem List   Diagnosis Date Noted   Status post total replacement of right hip 12/10/2022   Primary osteoarthritis of right hip 03/29/2022  Cough syncope 07/11/2015   Heart murmur 07/11/2015   Mitral valve prolapse 07/11/2015   HTN (hypertension) 07/11/2015   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 [place]  [place] [address] Phone: [phone] Fax: [phone]  [id number] [place], [place] - [address] AT [facility] [address] [place] [address] Phone: [phone] Fax: [phone]     Social Determinants of Health (SDOH) Social History: SDOH Screenings   Food Insecurity: No Food Insecurity (12/10/2022)  Housing: Low Risk  (12/10/2022)  Transportation Needs: No Transportation Needs (12/10/2022)  Utilities: Not At Risk (12/10/2022)  Tobacco Use: Medium Risk (12/10/2022)   SDOH Interventions:     Readmission Risk Interventions     No data to display

## 2022-12-11 NOTE — TOC Progression Note (Signed)
Transition of Care [facility]) - Progression Note    Patient Details  Name: Elizabeth Branch MRN: [id number] Date of Birth: 08/20/1945  [facility]) CM/SW Contact  Erin Sons, [place] Phone Number: 12/11/2022, 3:51 PM  Clinical Narrative:     [facility] rehab can offer a bed. CSW initiated SNF auth request in online portal. [id number]  Expected Discharge Plan: Skilled Nursing Facility Barriers to Discharge: Continued Medical Work up  Expected Discharge Plan and Services   Discharge Planning Services: CM Consult   Living arrangements for the past 2 months: Single Family Home                 DME Arranged: Walker rolling, Bedside commode DME Agency: [employer] Date DME Agency Contacted: 12/10/22 Time DME Agency Contacted: 1537 Representative spoke with at DME Agency: DOLONDA HH Arranged: PT HH Agency: [facility] Date [facility] Agency Contacted: 12/10/22 Time HH Agency Contacted: 1532     Social Determinants of Health (SDOH) Interventions SDOH Screenings   Food Insecurity: No Food Insecurity (12/10/2022)  Housing: Low Risk  (12/10/2022)  Transportation Needs: No Transportation Needs (12/10/2022)  Utilities: Not At Risk (12/10/2022)  Tobacco Use: Medium Risk (12/10/2022)    Readmission Risk Interventions     No data to display

## 2022-12-11 NOTE — Evaluation (Signed)
Occupational Therapy Evaluation Patient Details Name: Elizabeth Branch MRN: [id number] DOB: 1945/08/11 Today's Date: 12/11/2022   History of Present Illness 77 y.o. female presents to [facility] hospital on 12/10/2022 for elective R THA. PMH includes asthma, cataract, heart murmur, HTN.   Clinical Impression   Pt s/p above diagnosis. Pt 5/10 pain at rest, increased with movement, lives alone, no support at home after DC. Pt PLOF mod I, has had trouble with ambulation and LB dressing/bathing due to R hip pain for months. Pt currently requires significant assistance to perform transfers, LB dressing/bathing, toileting, increased time for ambulation due to limited ability to WB on RLE. Pt would benefit greatly from post acute care, <3hrs/day to improve function to be able to return home safely. Pt to be seen acutely during stay to maximize function.      Recommendations for follow up therapy are one component of a multi-disciplinary discharge planning process, led by the attending physician.  Recommendations may be updated based on patient status, additional functional criteria and insurance authorization.   Assistance Recommended at Discharge Frequent or constant Supervision/Assistance  Patient can return home with the following A little help with walking and/or transfers;A lot of help with bathing/dressing/bathroom;Assistance with cooking/housework;Assist for transportation;Help with stairs or ramp for entrance    Functional Status Assessment  Patient has had a recent decline in their functional status and demonstrates the ability to make significant improvements in function in a reasonable and predictable amount of time.  Equipment Recommendations  Other (comment) (defer)    Recommendations for Other Services       Precautions / Restrictions Precautions Precautions: Fall Precaution Comments: direct anterior THA Restrictions Weight Bearing Restrictions: Yes RLE Weight Bearing: Weight bearing as  tolerated      Mobility Bed Mobility Overal bed mobility: Needs Assistance Bed Mobility: Sit to Supine       Sit to supine: Min assist   General bed mobility comments: min A for BLEs sit to supine    Transfers Overall transfer level: Needs assistance Equipment used: Rolling walker (2 wheels) Transfers: Sit to/from Stand, Bed to chair/wheelchair/BSC Sit to Stand: Min assist     Step pivot transfers: Min guard     General transfer comment: increased time, limited ability to put pressure on RLE, small steps      Balance Overall balance assessment: Needs assistance Sitting-balance support: No upper extremity supported, Feet supported Sitting balance-Leahy Scale: Fair Sitting balance - Comments: sitting in recliner, increased time to sit up due to R hip pain   Standing balance support: Bilateral upper extremity supported, Reliant on assistive device for balance Standing balance-Leahy Scale: Poor Standing balance comment: reliant on RW, limited ability to put pressure on RLE                           ADL either performed or assessed with clinical judgement   ADL Overall ADL's : Needs assistance/impaired Eating/Feeding: Independent   Grooming: Set up;Sitting   Upper Body Bathing: Set up;Sitting   Lower Body Bathing: Maximal assistance;Sitting/lateral leans   Upper Body Dressing : Set up;Sitting   Lower Body Dressing: Maximal assistance;Sitting/lateral leans   Toilet Transfer: Minimal assistance   Toileting- Clothing Manipulation and Hygiene: Moderate assistance;Sit to/from stand       Functional mobility during ADLs: Min guard;Rolling walker (2 wheels)       Vision Baseline Vision/History: 1 Wears glasses Ability to See in Adequate Light: 0 Adequate Patient Visual Report:  No change from baseline       Perception     Praxis      Pertinent Vitals/Pain Pain Assessment Pain Assessment: 0-10 Pain Score: 5  Pain Location: R hip Pain  Descriptors / Indicators: Sore, Aching Pain Intervention(s): Monitored during session     Hand Dominance     Extremity/Trunk Assessment Upper Extremity Assessment Upper Extremity Assessment: Overall WFL for tasks assessed   Lower Extremity Assessment Lower Extremity Assessment: Defer to PT evaluation       Communication Communication Communication: No difficulties   Cognition Arousal/Alertness: Awake/alert Behavior During Therapy: WFL for tasks assessed/performed Overall Cognitive Status: Within Functional Limits for tasks assessed                                       General Comments       Exercises     Shoulder Instructions      Home Living Family/patient expects to be discharged to:: Private residence Living Arrangements: Alone Available Help at Discharge: Family Type of Home: House Home Access: Stairs to enter Secretary/administrator of Steps: 2 Entrance Stairs-Rails: None Home Layout: Multi-level;Able to live on main level with bedroom/bathroom     Bathroom Shower/Tub: Chief Strategy Officer: Standard     Home Equipment: Cane - single point;Shower seat   Additional Comments: Pt lives alone, no assistance from family/friends at home      Prior Functioning/Environment Prior Level of Function : Independent/Modified Independent;Driving             Mobility Comments: ambulatory without DME          OT Problem List: Decreased strength;Decreased range of motion;Decreased activity tolerance;Impaired balance (sitting and/or standing);Decreased safety awareness;Pain      OT Treatment/Interventions: Self-care/ADL training;Therapeutic exercise;Energy conservation;DME and/or AE instruction;Therapeutic activities;Patient/family education    OT Goals(Current goals can be found in the care plan section) Acute Rehab OT Goals Patient Stated Goal: to go somewhere to recieve therapy prior to going home, lives alone OT Goal  Formulation: With patient/family Time For Goal Achievement: 12/25/22 Potential to Achieve Goals: Good  OT Frequency: Min 2X/week    Co-evaluation              AM-PAC OT "6 Clicks" Daily Activity     Outcome Measure Help from another person eating meals?: None Help from another person taking care of personal grooming?: A Little Help from another person toileting, which includes using toliet, bedpan, or urinal?: A Lot Help from another person bathing (including washing, rinsing, drying)?: A Lot Help from another person to put on and taking off regular upper body clothing?: A Little Help from another person to put on and taking off regular lower body clothing?: A Lot 6 Click Score: 16   End of Session Equipment Utilized During Treatment: Gait belt;Rolling walker (2 wheels) Nurse Communication: Mobility status  Activity Tolerance: Patient tolerated treatment well Patient left: in bed;with call bell/phone within reach;with family/visitor present  OT Visit Diagnosis: Unsteadiness on feet (R26.81);Other abnormalities of gait and mobility (R26.89);Muscle weakness (generalized) (M62.81);Pain Pain - Right/Left: Right Pain - part of body: Hip                Time: 1610-9604 OT Time Calculation (min): 28 min Charges:  OT General Charges $OT Visit: 1 Visit OT Evaluation $OT Eval Moderate Complexity: 1 Mod OT Treatments $Self Care/Home Management : 8-22 mins  Downing, OTR/L   Alexis Goodell 12/11/2022, 11:44 AM

## 2022-12-11 NOTE — Anesthesia Postprocedure Evaluation (Signed)
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  Last Vitals:  Vitals:   12/11/22 0740 12/11/22 1420  BP: (!) 136/43 (!) 153/41  Pulse: 72 71  Resp:    Temp: 37 C 37.1 C  SpO2: 98% 96%    Last Pain:  Vitals:   12/11/22 1420  TempSrc: Oral  PainSc:    Pain Goal: Patients Stated Pain Goal: 0 (12/10/22 1235)                 Trevor Iha

## 2022-12-11 NOTE — Progress Notes (Signed)
Physical Therapy Treatment Patient Details Name: Elizabeth Branch MRN: [id number] DOB: [birth year] Today's Date: 12/11/2022   History of Present Illness 77 y.o. female presents to [facility] hospital on 12/10/2022 for elective R THA. PMH includes asthma, cataract, heart murmur, HTN.    PT Comments  Pt tolerated second treatment well today. Pt with similar presentation to session this AM. No change in DC/DME recs at this time. PT will continue to follow.    Assistance Recommended at Discharge Intermittent Supervision/Assistance  If plan is discharge home, recommend the following:  Can travel by private vehicle    A little help with walking and/or transfers;A lot of help with bathing/dressing/bathroom;Assistance with cooking/housework;Assist for transportation;Help with stairs or ramp for entrance   No  Equipment Recommendations  Other (comment) (Per accepting facility)    Recommendations for Other Services       Precautions / Restrictions Precautions Precautions: Fall Precaution Comments: direct anterior THA Restrictions Weight Bearing Restrictions: No RLE Weight Bearing: Weight bearing as tolerated Other Position/Activity Restrictions: Full WB per surgeon note     Mobility  Bed Mobility Overal bed mobility: Needs Assistance Bed Mobility: Supine to Sit     Supine to sit: Min assist, HOB elevated Sit to supine: Min assist   General bed mobility comments: min A for RLE management to EOB and assistance with BLE management back to bed.    Transfers Overall transfer level: Needs assistance Equipment used: Rolling walker (2 wheels) Transfers: Sit to/from Stand Sit to Stand: Min assist           General transfer comment: increased time, limited ability to put pressure on RLE.    Ambulation/Gait Ambulation/Gait assistance: Min guard Gait Distance (Feet): 25 Feet Assistive device: Rolling walker (2 wheels) Gait Pattern/deviations: Decreased stride length, Step-to pattern,  Antalgic Gait velocity: reduced     General Gait Details: slowed step-to gait, reduced step length bilaterally   Stairs             Wheelchair Mobility     Tilt Bed    Modified Rankin (Stroke Patients Only)       Balance Overall balance assessment: Needs assistance Sitting-balance support: No upper extremity supported, Feet supported Sitting balance-Leahy Scale: Fair Sitting balance - Comments: Able to sit EOB   Standing balance support: Bilateral upper extremity supported, Reliant on assistive device for balance Standing balance-Leahy Scale: Poor Standing balance comment: reliant on RW, limited ability to put pressure on RLE                            Cognition Arousal/Alertness: Awake/alert Behavior During Therapy: WFL for tasks assessed/performed Overall Cognitive Status: Within Functional Limits for tasks assessed                                          Exercises      General Comments General comments (skin integrity, edema, etc.): VSS on RA      Pertinent Vitals/Pain Pain Assessment Pain Assessment: Faces Pain Score: 5  Faces Pain Scale: Hurts little more Pain Location: R hip Pain Descriptors / Indicators: Sore, Aching Pain Intervention(s): Monitored during session, Limited activity within patient's tolerance, Repositioned    Home Living Family/patient expects to be discharged to:: Private residence Living Arrangements: Alone Available Help at Discharge: Family Type of Home: House Home Access: Stairs to enter Entrance Stairs-Rails:  None Entrance Stairs-Number of Steps: 2   Home Layout: Multi-level;Able to live on main level with bedroom/bathroom Home Equipment: Gilmer Mor - single point;Shower seat Additional Comments: Pt lives alone, no assistance from family/friends at home    Prior Function            PT Goals (current goals can now be found in the care plan section) Progress towards PT goals: Progressing  toward goals    Frequency    7X/week      PT Plan Current plan remains appropriate    Co-evaluation              AM-PAC PT "6 Clicks" Mobility   Outcome Measure  Help needed turning from your back to your side while in a flat bed without using bedrails?: A Little Help needed moving from lying on your back to sitting on the side of a flat bed without using bedrails?: A Little Help needed moving to and from a bed to a chair (including a wheelchair)?: A Little Help needed standing up from a chair using your arms (e.g., wheelchair or bedside chair)?: A Little Help needed to walk in hospital room?: A Little Help needed climbing 3-5 steps with a railing? : Total 6 Click Score: 16    End of Session Equipment Utilized During Treatment: Gait belt Activity Tolerance: Patient tolerated treatment well Patient left: in chair;with call bell/phone within reach;with family/visitor present Nurse Communication: Mobility status PT Visit Diagnosis: Other abnormalities of gait and mobility (R26.89);Muscle weakness (generalized) (M62.81);Pain Pain - Right/Left: Right Pain - part of body: Hip     Time: 1426-1450 PT Time Calculation (min) (ACUTE ONLY): 24 min  Charges:    $Gait Training: 23-37 mins PT General Charges $$ ACUTE PT VISIT: 1 Visit                     Shela Nevin, PT, DPT Acute Rehab Services [phone]    [name] 12/11/2022, 3:11 PM

## 2022-12-11 NOTE — Progress Notes (Signed)
Physical Therapy Treatment Patient Details Name: Elizabeth Branch MRN: [id number] DOB: 12-28-1945 Today's Date: 12/11/2022   History of Present Illness 77 y.o. female presents to [facility] hospital on 12/10/2022 for elective R THA. PMH includes asthma, cataract, heart murmur, HTN.    PT Comments  Pt tolerated treatment well today. Pt was able to ambulate to bathroom and back to chair with RW Min G. Pt and son upset about DC plan stating that they would like to talk to CM about SNF as pt has no support at home. CM and SW made aware and DC recs updated to SNF. PT will continue to follow.     Assistance Recommended at Discharge Intermittent Supervision/Assistance  If plan is discharge home, recommend the following:  Can travel by private vehicle    A little help with walking and/or transfers;A lot of help with bathing/dressing/bathroom;Assistance with cooking/housework;Assist for transportation;Help with stairs or ramp for entrance   No  Equipment Recommendations  Other (comment) (Per accepting facility)    Recommendations for Other Services       Precautions / Restrictions Precautions Precautions: Fall Precaution Comments: direct anterior THA Restrictions Weight Bearing Restrictions: No RLE Weight Bearing: Weight bearing as tolerated Other Position/Activity Restrictions: Full WB per surgeon note     Mobility  Bed Mobility Overal bed mobility: Needs Assistance Bed Mobility: Supine to Sit     Supine to sit: Min assist, HOB elevated     General bed mobility comments: min A for RLE management    Transfers Overall transfer level: Needs assistance Equipment used: Rolling walker (2 wheels) Transfers: Sit to/from Stand Sit to Stand: Min assist           General transfer comment: increased time, limited ability to put pressure on RLE.    Ambulation/Gait Ambulation/Gait assistance: Min guard Gait Distance (Feet): 20 Feet Assistive device: Rolling walker (2 wheels) Gait  Pattern/deviations: Decreased stride length, Step-to pattern, Antalgic Gait velocity: reduced     General Gait Details: slowed step-to gait, reduced step length bilaterally   Stairs             Wheelchair Mobility     Tilt Bed    Modified Rankin (Stroke Patients Only)       Balance Overall balance assessment: Needs assistance Sitting-balance support: No upper extremity supported, Feet supported Sitting balance-Leahy Scale: Fair Sitting balance - Comments: Able to sit EOB   Standing balance support: Bilateral upper extremity supported, Reliant on assistive device for balance Standing balance-Leahy Scale: Poor Standing balance comment: reliant on RW, limited ability to put pressure on RLE                            Cognition Arousal/Alertness: Awake/alert Behavior During Therapy: WFL for tasks assessed/performed Overall Cognitive Status: Within Functional Limits for tasks assessed                                          Exercises      General Comments General comments (skin integrity, edema, etc.): VSS on RA      Pertinent Vitals/Pain Pain Assessment Pain Assessment: 0-10 Pain Score: 5  Pain Location: R hip Pain Descriptors / Indicators: Sore, Aching Pain Intervention(s): Monitored during session, Patient requesting pain meds-RN notified, Limited activity within patient's tolerance    Home Living Family/patient expects to be discharged to:: Private  residence Living Arrangements: Alone Available Help at Discharge: Family Type of Home: House Home Access: Stairs to enter Entrance Stairs-Rails: None Entrance Stairs-Number of Steps: 2   Home Layout: Multi-level;Able to live on main level with bedroom/bathroom Home Equipment: Gilmer Mor - single point;Shower seat Additional Comments: Pt lives alone, no assistance from family/friends at home    Prior Function            PT Goals (current goals can now be found in the care plan  section) Progress towards PT goals: Progressing toward goals    Frequency    7X/week      PT Plan Discharge plan needs to be updated    Co-evaluation              AM-PAC PT "6 Clicks" Mobility   Outcome Measure  Help needed turning from your back to your side while in a flat bed without using bedrails?: A Little Help needed moving from lying on your back to sitting on the side of a flat bed without using bedrails?: A Little Help needed moving to and from a bed to a chair (including a wheelchair)?: A Little Help needed standing up from a chair using your arms (e.g., wheelchair or bedside chair)?: A Little Help needed to walk in hospital room?: A Little Help needed climbing 3-5 steps with a railing? : Total 6 Click Score: 16    End of Session Equipment Utilized During Treatment: Gait belt Activity Tolerance: Patient tolerated treatment well Patient left: in chair;with call bell/phone within reach;with family/visitor present Nurse Communication: Mobility status PT Visit Diagnosis: Other abnormalities of gait and mobility (R26.89);Muscle weakness (generalized) (M62.81);Pain Pain - Right/Left: Right Pain - part of body: Hip     Time: 1007-1029 PT Time Calculation (min) (ACUTE ONLY): 22 min  Charges:    $Gait Training: 8-22 mins PT General Charges $$ ACUTE PT VISIT: 1 Visit                     Elizabeth Branch, PT, DPT Acute Rehab Services [phone]    [name] 12/11/2022, 12:09 PM

## 2022-12-11 NOTE — Progress Notes (Signed)
Subjective: 1 Day Post-Op Procedure(s) (LRB): RIGHT TOTAL HIP ARTHROPLASTY ANTERIOR APPROACH (Right) Patient reports pain as mild.    Objective: Vital signs in last 24 hours: Temp:  [97 F (36.1 C)-98.6 F (37 C)] 98.6 F (37 C) (07/09 0740) Pulse Rate:  [46-72] 72 (07/09 0740) Resp:  [12-21] 17 (07/09 0450) BP: (124-166)/(42-72) 136/43 (07/09 0740) SpO2:  [90 %-100 %] 98 % (07/09 0740)  Intake/Output from previous day: 07/08 0701 - 07/09 0700 In: 1400 [I.V.:1000; IV Piggyback:400] Out: 400 [Urine:200; Blood:200] Intake/Output this shift: No intake/output data recorded.  Recent Labs    12/11/22 0455  HGB 9.7*   Recent Labs    12/11/22 0455  WBC 10.4  RBC 3.21*  HCT 29.1*  PLT 198   No results for input(s): "NA", "K", "CL", "CO2", "BUN", "CREATININE", "GLUCOSE", "CALCIUM" in the last 72 hours. No results for input(s): "LABPT", "INR" in the last 72 hours.  Neurologically intact Neurovascular intact Sensation intact distally Intact pulses distally Dorsiflexion/Plantar flexion intact Incision: dressing C/D/I No cellulitis present Compartment soft Hemovac drain in place with minimal blood in canister   Assessment/Plan: 1 Day Post-Op Procedure(s) (LRB): RIGHT TOTAL HIP ARTHROPLASTY ANTERIOR APPROACH (Right) Advance diet Up with therapy D/C IV fluids Discharge to SNF once has urinated on her own and insurance approves WBAT RLE ABLA- mild and stable Hemovac drain pulled by me today      [name] 12/11/2022, 7:55 AM

## 2022-12-11 NOTE — [place] FL2 (Signed)
[place] MEDICAID FL2 LEVEL OF CARE FORM     IDENTIFICATION  Patient Name: Elizabeth Branch Birthdate: Jul 29, 1945 Sex: female Admission Date (Current Location): 12/10/2022  [facility] and [place] Number:  Producer, television/film/video and Address:  The . [facility], [address], [place], [place] [address]      Provider Number: [id number]  Attending Physician Name and Address:  Tarry Kos, MD  Relative Name and Phone Number:       Current Level of Care: Hospital Recommended Level of Care: Skilled Nursing Facility Prior Approval Number: [id number]  Date Approved/Denied:   PASRR Number:    Discharge Plan: SNF    Current Diagnoses: Patient Active Problem List   Diagnosis Date Noted   Status post total replacement of right hip 12/10/2022   Primary osteoarthritis of right hip 03/29/2022   Cough syncope 07/11/2015   Heart murmur 07/11/2015   Mitral valve prolapse 07/11/2015   HTN (hypertension) 07/11/2015   Cough syncope syndrome 07/11/2015    Orientation RESPIRATION BLADDER Height & Weight     Self, Time, Situation, Place  Normal Continent Weight: 159 lb 13.3 oz (72.5 kg) Height:  5\' 2"  (157.5 cm)  BEHAVIORAL SYMPTOMS/MOOD NEUROLOGICAL BOWEL NUTRITION STATUS      Continent Diet (see d/c summary)  AMBULATORY STATUS COMMUNICATION OF NEEDS Skin   Extensive Assist Verbally Surgical wounds (Incision right hip)                       Personal Care Assistance Level of Assistance  Bathing, Feeding, Dressing Bathing Assistance: Limited assistance Feeding assistance: Independent Dressing Assistance: Limited assistance     Functional Limitations Info  Sight, Hearing, Speech Sight Info: Adequate Hearing Info: Adequate Speech Info: Adequate    SPECIAL CARE FACTORS FREQUENCY  PT (By licensed PT), OT (By licensed OT)     PT Frequency: 5x/week OT Frequency: 5x/week            Contractures Contractures Info: Not present    Additional Factors  Info  Code Status, Allergies Code Status Info: Full code Allergies Info: Egg-derived Products, codeine, naproxen, pineapple, Penicillins           Current Medications (12/11/2022):  This is the current hospital active medication list Current Facility-Administered Medications  Medication Dose Route Frequency Provider Last Rate Last Admin   0.9 %  sodium chloride infusion   Intravenous Continuous Tarry Kos, MD       acetaminophen (TYLENOL) tablet 1,000 mg  1,000 mg Oral Q6H Tarry Kos, MD   1,000 mg at 12/11/22 0516   acetaminophen (TYLENOL) tablet 325-650 mg  325-650 mg Oral Q6H PRN Tarry Kos, MD       alum & mag hydroxide-simeth (MAALOX/MYLANTA) 200-200-20 MG/5ML suspension 30 mL  30 mL Oral Q4H PRN Tarry Kos, MD       aspirin chewable tablet 81 mg  81 mg Oral BID Tarry Kos, MD   81 mg at 12/11/22 1031   diphenhydrAMINE (BENADRYL) 12.5 MG/5ML elixir 25 mg  25 mg Oral Q4H PRN Tarry Kos, MD       docusate sodium (COLACE) capsule 100 mg  100 mg Oral BID Tarry Kos, MD   100 mg at 12/11/22 1030   ferrous sulfate tablet 325 mg  325 mg Oral TID PC Tarry Kos, MD   325 mg at 12/11/22 1031   hydrochlorothiazide (HYDRODIURIL) tablet 50 mg  50 mg Oral Daily  Tarry Kos, MD   50 mg at 12/11/22 1031   HYDROmorphone (DILAUDID) injection 0.5-1 mg  0.5-1 mg Intravenous Q4H PRN Tarry Kos, MD   1 mg at 12/10/22 1814   magnesium citrate solution 1 Bottle  1 Bottle Oral Once PRN Tarry Kos, MD       magnesium oxide (MAG-OX) tablet 400 mg  400 mg Oral QHS Tarry Kos, MD   400 mg at 12/10/22 2041   menthol-cetylpyridinium (CEPACOL) lozenge 3 mg  1 lozenge Oral PRN Tarry Kos, MD       Or   phenol (CHLORASEPTIC) mouth spray 1 spray  1 spray Mouth/Throat PRN Tarry Kos, MD       methocarbamol (ROBAXIN) tablet 500 mg  500 mg Oral Q6H PRN Tarry Kos, MD   500 mg at 12/11/22 7253   Or   methocarbamol (ROBAXIN) 500 mg in dextrose 5 % 50 mL IVPB  500 mg Intravenous Q6H  PRN Tarry Kos, MD       metoCLOPramide (REGLAN) tablet 5-10 mg  5-10 mg Oral Q8H PRN Tarry Kos, MD       Or   metoCLOPramide (REGLAN) injection 5-10 mg  5-10 mg Intravenous Q8H PRN Tarry Kos, MD       ondansetron [facility]) tablet 4 mg  4 mg Oral Q6H PRN Tarry Kos, MD       Or   ondansetron [facility]) injection 4 mg  4 mg Intravenous Q6H PRN Tarry Kos, MD       oxyCODONE (Oxy IR/ROXICODONE) immediate release tablet 10-15 mg  10-15 mg Oral Q4H PRN Tarry Kos, MD       oxyCODONE (Oxy IR/ROXICODONE) immediate release tablet 5-10 mg  5-10 mg Oral Q4H PRN Tarry Kos, MD   10 mg at 12/11/22 1031   oxyCODONE (OXYCONTIN) 12 hr tablet 10 mg  10 mg Oral Q12H Tarry Kos, MD   10 mg at 12/11/22 1031   pantoprazole (PROTONIX) EC tablet 40 mg  40 mg Oral Daily Tarry Kos, MD   40 mg at 12/11/22 1031   polyethylene glycol (MIRALAX / GLYCOLAX) packet 17 g  17 g Oral Daily Tarry Kos, MD   17 g at 12/11/22 1030   sorbitol 70 % solution 30 mL  30 mL Oral Daily PRN Tarry Kos, MD       tranexamic acid (CYKLOKAPRON) 2,000 mg in sodium chloride 0.9 % 50 mL Topical Application  2,000 mg Topical Once [name], PA-C         Discharge Medications: Please see discharge summary for a list of discharge medications.  Relevant Imaging Results:  Relevant Lab Results:   Additional Information SSN [id number] [place], [place]

## 2022-12-12 DIAGNOSIS — M1611 Unilateral primary osteoarthritis, right hip: Secondary | ICD-10-CM | POA: Diagnosis not present

## 2022-12-12 NOTE — Progress Notes (Signed)
Physical Therapy Treatment Patient Details Name: Elizabeth Branch MRN: [id number] DOB: 26-Nov-1945 Today's Date: 12/12/2022   History of Present Illness 77 y.o. female presents to [facility] hospital on 12/10/2022 for elective R THA. PMH includes asthma, cataract, heart murmur, HTN.    PT Comments  Pt tolerated treatment well today. Pt was able to progress ambulation today in hallway with RW Min G. No change in DC/DME recs at this time. Pt anticipates DC to SNF today.     Assistance Recommended at Discharge Intermittent Supervision/Assistance  If plan is discharge home, recommend the following:  Can travel by private vehicle    A little help with walking and/or transfers;A lot of help with bathing/dressing/bathroom;Assistance with cooking/housework;Assist for transportation;Help with stairs or ramp for entrance   No  Equipment Recommendations  Other (comment)    Recommendations for Other Services       Precautions / Restrictions Precautions Precautions: Fall Restrictions Weight Bearing Restrictions: No RLE Weight Bearing: Weight bearing as tolerated Other Position/Activity Restrictions: Full WB per surgeon note     Mobility  Bed Mobility Overal bed mobility: Needs Assistance Bed Mobility: Supine to Sit     Supine to sit: Min guard, HOB elevated     General bed mobility comments: Cues for using LLE to assist RLE OOB.    Transfers Overall transfer level: Needs assistance Equipment used: Rolling walker (2 wheels) Transfers: Sit to/from Stand Sit to Stand: Min assist           General transfer comment: Cues for hand placement and assistance with steadying once upright.    Ambulation/Gait Ambulation/Gait assistance: Min guard Gait Distance (Feet): 125 Feet Assistive device: Rolling walker (2 wheels) Gait Pattern/deviations: Decreased stride length, Step-to pattern, Antalgic Gait velocity: reduced     General Gait Details: slowed step-to gait, reduced step length  bilaterally. Cues for proximity to RW.   Stairs             Wheelchair Mobility     Tilt Bed    Modified Rankin (Stroke Patients Only)       Balance Overall balance assessment: Needs assistance Sitting-balance support: No upper extremity supported, Feet supported Sitting balance-Leahy Scale: Fair Sitting balance - Comments: Able to sit EOB   Standing balance support: Bilateral upper extremity supported, Reliant on assistive device for balance Standing balance-Leahy Scale: Poor Standing balance comment: Reliant on RW                            Cognition Arousal/Alertness: Awake/alert Behavior During Therapy: WFL for tasks assessed/performed Overall Cognitive Status: Within Functional Limits for tasks assessed                                          Exercises      General Comments General comments (skin integrity, edema, etc.): VSS on RA      Pertinent Vitals/Pain Pain Assessment Pain Assessment: Faces Faces Pain Scale: Hurts a little bit Pain Location: R hip Pain Descriptors / Indicators: Sore, Aching Pain Intervention(s): Monitored during session, Repositioned    Home Living                          Prior Function            PT Goals (current goals can now be found in the  care plan section) Progress towards PT goals: Progressing toward goals    Frequency    7X/week      PT Plan Current plan remains appropriate    Co-evaluation              AM-PAC PT "6 Clicks" Mobility   Outcome Measure  Help needed turning from your back to your side while in a flat bed without using bedrails?: A Little Help needed moving from lying on your back to sitting on the side of a flat bed without using bedrails?: A Little Help needed moving to and from a bed to a chair (including a wheelchair)?: A Little Help needed standing up from a chair using your arms (e.g., wheelchair or bedside chair)?: A Little Help  needed to walk in hospital room?: A Little Help needed climbing 3-5 steps with a railing? : Total 6 Click Score: 16    End of Session Equipment Utilized During Treatment: Gait belt Activity Tolerance: Patient tolerated treatment well Patient left: in chair;with call bell/phone within reach;with family/visitor present Nurse Communication: Mobility status PT Visit Diagnosis: Other abnormalities of gait and mobility (R26.89);Muscle weakness (generalized) (M62.81);Pain Pain - Right/Left: Right Pain - part of body: Hip     Time: 1610-9604 PT Time Calculation (min) (ACUTE ONLY): 24 min  Charges:    $Gait Training: 23-37 mins PT General Charges $$ ACUTE PT VISIT: 1 Visit                     Elizabeth Branch, PT, DPT Acute Rehab Services [phone]    Elizabeth Branch 12/12/2022, 9:40 AM

## 2022-12-12 NOTE — Discharge Summary (Signed)
Patient ID: Elizabeth Branch MRN: [id number] DOB/AGE: 1945/11/04 77 y.o.  Admit date: 12/10/2022 Discharge date: 12/12/2022  Admission Diagnoses:  Principal Problem:   Primary osteoarthritis of right hip Active Problems:   Status post total replacement of right hip   Discharge Diagnoses:  Same  Past Medical History:  Diagnosis Date   Allergy    Arthritis    Asthma    Blood transfusion without reported diagnosis    Cataract    Family history of adverse reaction to anesthesia    Patient states her mother had a hard time waking up from anesthesia   Heart murmur    Hypertension    Seasonal allergies     Surgeries: Procedure(s): RIGHT TOTAL HIP ARTHROPLASTY ANTERIOR APPROACH on 12/10/2022   Consultants:   Discharged Condition: Improved  Hospital Course: Elizabeth Branch is an 77 y.o. female who was admitted 12/10/2022 for operative treatment ofPrimary osteoarthritis of right hip. Patient has severe unremitting pain that affects sleep, daily activities, and work/hobbies. After pre-op clearance the patient was taken to the operating room on 12/10/2022 and underwent  Procedure(s): RIGHT TOTAL HIP ARTHROPLASTY ANTERIOR APPROACH.    Patient was given perioperative antibiotics:  Anti-infectives (From admission, onward)    Start     Dose/Rate Route Frequency Ordered Stop   12/10/22 1600  ceFAZolin (ANCEF) IVPB 2g/100 mL premix        2 g 200 mL/hr over 30 Minutes Intravenous Every 6 hours 12/10/22 1340 12/10/22 2114   12/10/22 1013  vancomycin (VANCOCIN) powder  Status:  Discontinued          As needed 12/10/22 1013 12/10/22 1158   12/10/22 0730  ceFAZolin (ANCEF) IVPB 2g/100 mL premix        2 g 200 mL/hr over 30 Minutes Intravenous On call to O.R. 12/10/22 4098 12/10/22 0955        Patient was given sequential compression devices, early ambulation, and chemoprophylaxis to prevent DVT.  Patient benefited maximally from hospital stay and there were no complications.    Recent  vital signs: Patient Vitals for the past 24 hrs:  BP Temp Temp src Pulse Resp SpO2  12/12/22 0738 (!) 145/46 98.7 F (37.1 C) Oral 68 18 95 %  12/12/22 0558 -- 98.8 F (37.1 C) Oral -- -- --  12/12/22 0438 (!) 144/50 (!) 101 F (38.3 C) Oral 78 19 97 %  12/11/22 1957 (!) 144/45 98.6 F (37 C) Oral 74 19 92 %  12/11/22 1420 (!) 153/41 98.8 F (37.1 C) Oral 71 -- 96 %     Recent laboratory studies:  Recent Labs    12/11/22 0455  WBC 10.4  HGB 9.7*  HCT 29.1*  PLT 198     Discharge Medications:   Allergies as of 12/12/2022       Reactions   Codeine Nausea And Vomiting   Naproxen Nausea And Vomiting   Egg-derived Products Swelling, Rash   Penicillins Rash   Has patient had a PCN reaction causing immediate rash, facial/tongue/throat swelling, SOB or lightheadedness with hypotension: Yes   Pineapple Rash        Medication List     STOP taking these medications    acetaminophen 325 MG tablet Commonly known as: TYLENOL   celecoxib 100 MG capsule Commonly known as: CELEBREX   Glucosamine-Chondroitin 750-600 MG Tabs       TAKE these medications    albuterol 108 (90 Base) MCG/ACT inhaler Commonly known as: VENTOLIN HFA Inhale 1 puff  into the lungs every 6 (six) hours as needed for wheezing or shortness of breath.   aspirin EC 81 MG tablet Take 1 tablet (81 mg total) by mouth 2 (two) times daily as needed. To be taken after surgery to prevent blood clots   bismuth subsalicylate 262 MG chewable tablet Commonly known as: PEPTO BISMOL Chew 524 mg by mouth as needed for indigestion or diarrhea or loose stools.   docusate sodium 100 MG capsule Commonly known as: Colace Take 1 capsule (100 mg total) by mouth daily as needed.   Eye Lubricant Oint Place 1 Application into both eyes as needed (itching).   hydrochlorothiazide 25 MG tablet Commonly known as: HYDRODIURIL Take 50 mg by mouth daily.   LIPO FLAVONOID PLUS PO Take 2-3 tablets by mouth daily.    magnesium oxide 400 MG tablet Commonly known as: MAG-OX Take 400 mg by mouth at bedtime.   methocarbamol 750 MG tablet Commonly known as: Robaxin-750 Take 1 tablet (750 mg total) by mouth 2 (two) times daily as needed for muscle spasms.   montelukast 10 MG tablet Commonly known as: SINGULAIR Take 10 mg by mouth at bedtime.   ondansetron 4 MG tablet Commonly known as: Zofran Take 1 tablet (4 mg total) by mouth every 8 (eight) hours as needed for nausea or vomiting.   oxyCODONE-acetaminophen 5-325 MG tablet Commonly known as: Percocet Take 1-2 tablets by mouth every 6 (six) hours as needed. To  be taken after surgery   Polyethyl Glycol-Propyl Glycol 0.4-0.3 % Soln Place 1 drop into both eyes as needed (itching eyes).   PRENATAL PO Take 1 tablet by mouth daily.   PRESCRIPTION MEDICATION Apply 1 Application topically as needed (dry skin/eczema). Unknown cream   Turmeric 450 MG Caps Take 450 mg by mouth daily.   zinc gluconate 50 MG tablet Take 50 mg by mouth at bedtime.               Durable Medical Equipment  (From admission, onward)           Start     Ordered   12/10/22 1540  DME Bedside commode  Once       Comments: Confine to one room  Question:  Patient needs a bedside commode to treat with the following condition  Answer:  History of hip replacement   12/10/22 1540   12/10/22 1341  DME Walker rolling  Once       Question:  Patient needs a walker to treat with the following condition  Answer:  History of hip replacement   12/10/22 1340   12/10/22 1341  DME 3 n 1  Once        12/10/22 1340            Diagnostic Studies: DG Pelvis Portable  Result Date: 12/10/2022 CLINICAL DATA:  Status post hip replacement. EXAM: PORTABLE PELVIS 1-2 VIEWS COMPARISON:  None Available. FINDINGS: Right hip arthroplasty in expected alignment. No periprosthetic lucency or fracture. Recent postsurgical change includes air and edema in the soft tissues. Probable drain  laterally. IMPRESSION: Right hip arthroplasty without immediate postoperative complication. Electronically Signed   By: Narda Rutherford M.D.   On: 12/10/2022 13:14   DG HIP UNILAT WITH PELVIS 1V RIGHT  Result Date: 12/10/2022 CLINICAL DATA:  The right hip arthroplasty. Intraoperative fluoroscopy. EXAM: DG HIP (WITH OR WITHOUT PELVIS) 1V RIGHT COMPARISON:  Pelvis and right hip radiographs 09/12/2022, CT right hip FINDINGS: Images were performed intraoperatively without the presence of  a radiologist. Interval total right hip arthroplasty. No hardware complication is seen. Total fluoroscopy images: 2 Total fluoroscopy time: 33 seconds Total dose: Radiation Exposure Index (as provided by the fluoroscopic device): 2.00 mGy air Kerma Please see intraoperative findings for further detail. IMPRESSION: Intraoperative fluoroscopy for total right hip arthroplasty. Electronically Signed   By: Neita Garnet M.D.   On: 12/10/2022 11:54   DG C-Arm 1-60 Min-No Report  Result Date: 12/10/2022 Fluoroscopy was utilized by the requesting physician.  No radiographic interpretation.   DG C-Arm 1-60 Min-No Report  Result Date: 12/10/2022 Fluoroscopy was utilized by the requesting physician.  No radiographic interpretation.   CT HIP RIGHT WO CONTRAST  Result Date: 12/10/2022 CLINICAL DATA:  Right hip pain.  Osteoarthritis of the right hip. EXAM: CT OF THE RIGHT HIP WITHOUT CONTRAST TECHNIQUE: Multidetector CT imaging of the right hip was performed according to the standard protocol. Multiplanar CT image reconstructions were also generated. RADIATION DOSE REDUCTION: This exam was performed according to the departmental dose-optimization program which includes automated exposure control, adjustment of the mA and/or kV according to patient size and/or use of iterative reconstruction technique. COMPARISON:  None Available. FINDINGS: Bones/Joint/Cartilage No fracture or dislocation. Normal alignment. Small joint effusion. Severe  advanced osteoarthritis of the right hip with severe subchondral sclerosis and cystic changes. Bulky femoral head marginal osteophytes. Relative shallow acetabulum with partial uncovering of the femoral head which may reflect an underlying element of developmental dysplasia of the hip. Mild osteoarthritis of the right SI joint. Ligaments Ligaments are suboptimally evaluated by CT. Muscles and Tendons Muscles are normal. No muscle atrophy. No intramuscular fluid collection or hematoma. Soft tissue No fluid collection or hematoma. No soft tissue mass. Small fat containing right inguinal hernia. IMPRESSION: 1. Severe advanced osteoarthritis of the right hip. 2. Relative shallow acetabulum with partial uncovering of the femoral head which may reflect an underlying element of developmental dysplasia of the hip. Electronically Signed   By: Elige Ko M.D.   On: 12/10/2022 07:07    Disposition: Discharge disposition: 03-Skilled Nursing Facility          Follow-up Information     [name], [place]. Schedule an appointment as soon as possible for a visit in 2 week(s).   Specialty: Orthopedic Surgery Contact information: [address] [place] [address] [phone]         [facility] Follow up.   Why: Home health services will be provided by [facility], start of care within 48 hors post discharge Contact information: [address] [place] [address] [phone]                  Signed: [name] 12/12/2022, 7:51 AM

## 2022-12-12 NOTE — Progress Notes (Signed)
Subjective: 2 Days Post-Op Procedure(s) (LRB): RIGHT TOTAL HIP ARTHROPLASTY ANTERIOR APPROACH (Right) Patient reports pain as mild.    Objective: Vital signs in last 24 hours: Temp:  [98.6 F (37 C)-101 F (38.3 C)] 98.7 F (37.1 C) (07/10 0738) Pulse Rate:  [68-78] 68 (07/10 0738) Resp:  [18-19] 18 (07/10 0738) BP: (144-153)/(41-50) 145/46 (07/10 0738) SpO2:  [92 %-97 %] 95 % (07/10 0738)  Intake/Output from previous day: 07/09 0701 - 07/10 0700 In: 120 [P.O.:120] Out: 500 [Urine:500] Intake/Output this shift: No intake/output data recorded.  Recent Labs    12/11/22 0455  HGB 9.7*   Recent Labs    12/11/22 0455  WBC 10.4  RBC 3.21*  HCT 29.1*  PLT 198   No results for input(s): "NA", "K", "CL", "CO2", "BUN", "CREATININE", "GLUCOSE", "CALCIUM" in the last 72 hours. No results for input(s): "LABPT", "INR" in the last 72 hours.  Neurologically intact Neurovascular intact Sensation intact distally Intact pulses distally Dorsiflexion/Plantar flexion intact Incision: dressing C/D/I No cellulitis present Compartment soft   Assessment/Plan: 2 Days Post-Op Procedure(s) (LRB): RIGHT TOTAL HIP ARTHROPLASTY ANTERIOR APPROACH (Right) Advance diet Up with therapy D/C IV fluids Discharge to SNF once patient has urinated and insurance approves  WBAT RLE ABLA- mild and stable      Elizabeth Branch 12/12/2022, 7:49 AM

## 2022-12-12 NOTE — TOC Transition Note (Addendum)
Transition of Care [facility]) - CM/SW Discharge Note   Patient Details  Name: Elizabeth Branch MRN: [id number] Date of Birth: [birth year]  Transition of Care [facility]) CM/SW Contact:  Erin Sons, LCSW Phone Number: 12/12/2022, 11:20 AM   Clinical Narrative:   Berkley Harvey approved 12/12/2022-12/14/2022 [id number]   Patient will DC to: [facility] Anticipated DC date: 12/12/22 Family notified: Son Carollee Herter Transport by: Sharin Mons   Per MD patient ready for DC to [facility]. RN, patient, patient's family, and facility notified of DC. Discharge Summary and FL2 sent to facility. RN to call report prior to discharge [phone] ). DC packet on chart. Ambulance transport requested for patient.   CSW will sign off for now as social work intervention is no longer needed. Please consult [place] again if new needs arise.   Final next level of care: Skilled Nursing Facility Barriers to Discharge: No Barriers Identified   Patient Goals and CMS Choice      Discharge Placement                Patient chooses bed at:  [facility]) Patient to be transferred to facility by: [facility] Name of family member notified: Jeani Hawking Patient and family notified of of transfer: 12/12/22  Discharge Plan and Services Additional resources added to the After Visit Summary for     Discharge Planning Services: CM Consult            DME Arranged: Dan Humphreys rolling, Bedside commode DME Agency: [employer] Date DME Agency Contacted: 12/10/22 Time DME Agency Contacted: 1537 Representative spoke with at DME Agency: DOLONDA HH Arranged: PT HH Agency: [facility] Date [facility] Agency Contacted: 12/10/22 Time HH Agency Contacted: 1532    Social Determinants of Health (SDOH) Interventions SDOH Screenings   Food Insecurity: No Food Insecurity (12/10/2022)  Housing: Low Risk  (12/10/2022)  Transportation Needs: No Transportation Needs (12/10/2022)  Utilities: Not At Risk (12/10/2022)  Tobacco Use: Medium  Risk (12/10/2022)     Readmission Risk Interventions     No data to display

## 2022-12-25 ENCOUNTER — Encounter: Payer: Medicare HMO | Admitting: Physician Assistant

## 2022-12-26 ENCOUNTER — Ambulatory Visit (INDEPENDENT_AMBULATORY_CARE_PROVIDER_SITE_OTHER): Payer: Medicare HMO | Admitting: Physician Assistant

## 2022-12-26 DIAGNOSIS — Z96641 Presence of right artificial hip joint: Secondary | ICD-10-CM

## 2022-12-26 NOTE — Progress Notes (Signed)
   Post-Op Visit Note   Patient: Elizabeth Branch           Date of Birth: 05-09-1946           MRN: [id number] Visit Date: 12/26/2022 PCP: Ethelda Chick, MD   Assessment & Plan:  Chief Complaint:  Chief Complaint  Patient presents with   Right Hip - Routine Post Op   Visit Diagnoses:  1. Status post total replacement of right hip     Plan: Patient is a 77 year old female who comes in today 2 weeks status post right total hip replacement 12/10/2022.  She has been doing okay.  She has been getting physical therapy and is currently ambulating with a walker.  She has only been taking her aspirin once daily for DVT prophylaxis.  She is taking oxycodone for pain.  Examination of her right hip reveals a well-healing surgical incision with nylon sutures in place.  No evidence of infection or cellulitis.  Calves are soft nontender.  She is neurovascular intact distally.  Today, sutures were removed and Steri-Strips applied.  I have discussed taking her baby aspirin twice daily instead of once daily for DVT prophylaxis.  She understands and agrees.  She will follow-up in 4 weeks for repeat evaluation and AP pelvis x-rays.  Call with concerns or questions.  Follow-Up Instructions: Return in about 4 weeks (around 01/23/2023).   Orders:  No orders of the defined types were placed in this encounter.  No orders of the defined types were placed in this encounter.   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   Past Medical History:  Diagnosis Date   Allergy    Arthritis    Asthma    Blood transfusion without reported diagnosis    Cataract    Family history of adverse reaction to  anesthesia    Patient states her mother had a hard time waking up from anesthesia   Heart murmur    Hypertension    Seasonal allergies     Family History  Problem Relation Age of Onset   Colon cancer Cousin    Breast cancer Maternal Aunt     Past Surgical History:  Procedure Laterality Date   ABDOMINAL HYSTERECTOMY     APPENDECTOMY     BIOPSY TONGUE  11/2022   CATARACT EXTRACTION     PTERYGIUM EXCISION Left 11/2022   TONSILLECTOMY     TOTAL HIP ARTHROPLASTY Right 12/10/2022   Procedure: RIGHT TOTAL HIP ARTHROPLASTY ANTERIOR APPROACH;  Surgeon: Tarry Kos, MD;  Location: [facility] OR;  Service: Orthopedics;  Laterality: Right;  3-C   Social History   Occupational History   Not on file  Tobacco Use   Smoking status: Former    Current packs/day: 0.00    Types: Cigarettes    Quit date: 1984    Years since quitting: 40.5   Smokeless tobacco: Never  Substance and Sexual Activity   Alcohol use: Yes    Comment: occassional   Drug use: No   Sexual activity: Not on file

## 2023-01-07 ENCOUNTER — Telehealth: Payer: Self-pay | Admitting: Orthopaedic Surgery

## 2023-01-07 NOTE — Telephone Encounter (Signed)
Elizabeth Branch with [facility] request verbal orders continuing home health for  1 week 3 bathing and dressing ADL   Kim's call back number [phone]

## 2023-01-07 NOTE — Telephone Encounter (Signed)
ok 

## 2023-01-07 NOTE — Telephone Encounter (Signed)
Called and advised.

## 2023-01-23 ENCOUNTER — Encounter: Payer: Self-pay | Admitting: Orthopaedic Surgery

## 2023-01-23 ENCOUNTER — Ambulatory Visit (INDEPENDENT_AMBULATORY_CARE_PROVIDER_SITE_OTHER): Payer: Medicare HMO | Admitting: Orthopaedic Surgery

## 2023-01-23 ENCOUNTER — Other Ambulatory Visit: Payer: Self-pay

## 2023-01-23 DIAGNOSIS — Z96641 Presence of right artificial hip joint: Secondary | ICD-10-CM | POA: Diagnosis not present

## 2023-01-23 NOTE — Addendum Note (Signed)
Addended by: Wendi Maya on: 01/23/2023 10:00 AM   Modules accepted: Orders

## 2023-01-23 NOTE — Progress Notes (Signed)
   Post-Op Visit Note   Patient: Elizabeth Branch           Date of Birth: [birth year]           MRN: [id number] Visit Date: 01/23/2023 PCP: Ethelda Chick, MD   Assessment & Plan:  Chief Complaint:  Chief Complaint  Patient presents with   Right Hip - Follow-up    Right total hip arthroplasty 12/10/2022   Visit Diagnoses:  1. Status post total replacement of right hip     Plan: Elizabeth Branch is 6 weeks status post right total hip replacement.  She is doing well.  She mainly walks with a cane rarely with a walker.  She is eager to return back to driving.  Examination right hip shows fully healed surgical scar.  Fluid painless range of motion of the hip.  Equal leg lengths.  X-rays demonstrate stable implant without any complications.  She can drive at this point.  Other instructions were reviewed.  Dental prophylaxis reinforced.  Recheck in 6 weeks.  Prescription for outpatient PT provided.  Follow-Up Instructions: Return in about 6 weeks (around 03/06/2023) for lindsey.   Orders:  Orders Placed This Encounter  Procedures   XR Pelvis 1-2 Views   No orders of the defined types were placed in this encounter.   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   Past Medical History:  Diagnosis Date   Allergy    Arthritis    Asthma    Blood transfusion without reported diagnosis    Cataract    Family history of adverse reaction to anesthesia    Patient states her mother had a hard time waking up from anesthesia   Heart murmur    Hypertension    Seasonal allergies     Family History   Problem Relation Age of Onset   Colon cancer Cousin    Breast cancer Maternal Aunt     Past Surgical History:  Procedure Laterality Date   ABDOMINAL HYSTERECTOMY     APPENDECTOMY     BIOPSY TONGUE  11/2022   CATARACT EXTRACTION     PTERYGIUM EXCISION Left 11/2022   TONSILLECTOMY     TOTAL HIP ARTHROPLASTY Right 12/10/2022   Procedure: RIGHT TOTAL HIP ARTHROPLASTY ANTERIOR APPROACH;  Surgeon: Tarry Kos, MD;  Location: [facility] OR;  Service: Orthopedics;  Laterality: Right;  3-C   Social History   Occupational History   Not on file  Tobacco Use   Smoking status: Former    Current packs/day: 0.00    Types: Cigarettes    Quit date: 1984    Years since quitting: 40.6   Smokeless tobacco: Never  Substance and Sexual Activity   Alcohol use: Yes    Comment: occassional   Drug use: No   Sexual activity: Not on file

## 2023-03-07 ENCOUNTER — Encounter: Payer: Self-pay | Admitting: Physician Assistant

## 2023-03-07 ENCOUNTER — Ambulatory Visit: Payer: Medicare HMO | Admitting: Physician Assistant

## 2023-03-07 DIAGNOSIS — Z96641 Presence of right artificial hip joint: Secondary | ICD-10-CM

## 2023-03-07 NOTE — Progress Notes (Signed)
   Post-Op Visit Note   Patient: Elizabeth Branch           Date of Birth: Nov 06, 1945           MRN: [id number] Visit Date: 03/07/2023 PCP: Elizabeth Chick, MD   Assessment & Plan:  Chief Complaint:  Chief Complaint  Patient presents with   Right Hip - Follow-up    Right total hip arthroplasty 12/10/2022   Visit Diagnoses:  1. Status post total replacement of right hip     Plan: Patient is a pleasant 77 year old female who comes in today 3 months status post right total hip replacement 12/10/2022.  She is continuing to improve.  She is primarily ambulating with 1 cane at home but does tell me she used her walker today as she took public transportation.  She also tells me she has been using a cane for 19 years and prior to surgery she was using 2.  She is still in physical therapy making progress.  She has very little pain.  Examination of her right hip reveals painless hip flexion and logroll.  She is neurovascularly intact distally.  At this point, she will continue to advance with activity as tolerated.  Dental prophylaxis reinforced.  Follow-up in 3 months for repeat evaluation and AP pelvis x-rays.  Call with concerns or questions.  Follow-Up Instructions: Return in about 3 months (around 06/07/2023).   Orders:  No orders of the defined types were placed in this encounter.  No orders of the defined types were placed in this encounter.   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   Past Medical History:  Diagnosis Date   Allergy    Arthritis    Asthma    Blood transfusion without reported diagnosis    Cataract    Family history of adverse reaction to anesthesia     Patient states her mother had a hard time waking up from anesthesia   Heart murmur    Hypertension    Seasonal allergies     Family History  Problem Relation Age of Onset   Colon cancer Cousin    Breast cancer Maternal Aunt     Past Surgical History:  Procedure Laterality Date   ABDOMINAL HYSTERECTOMY     APPENDECTOMY     BIOPSY TONGUE  11/2022   CATARACT EXTRACTION     PTERYGIUM EXCISION Left 11/2022   TONSILLECTOMY     TOTAL HIP ARTHROPLASTY Right 12/10/2022   Procedure: RIGHT TOTAL HIP ARTHROPLASTY ANTERIOR APPROACH;  Surgeon: Tarry Kos, MD;  Location: [facility] OR;  Service: Orthopedics;  Laterality: Right;  3-C   Social History   Occupational History   Not on file  Tobacco Use   Smoking status: Former    Current packs/day: 0.00    Types: Cigarettes    Quit date: 1984    Years since quitting: 40.7   Smokeless tobacco: Never  Substance and Sexual Activity   Alcohol use: Yes    Comment: occassional   Drug use: No   Sexual activity: Not on file

## 2023-04-01 ENCOUNTER — Telehealth: Payer: Self-pay | Admitting: Orthopaedic Surgery

## 2023-04-01 ENCOUNTER — Other Ambulatory Visit: Payer: Self-pay | Admitting: Physician Assistant

## 2023-04-01 NOTE — Telephone Encounter (Signed)
Refill of what?  

## 2023-04-01 NOTE — Telephone Encounter (Signed)
Patient called said she needs a refill. CB#[id number]

## 2023-04-01 NOTE — Telephone Encounter (Signed)
Oxycodone.  [facility]. [address]

## 2023-04-02 ENCOUNTER — Other Ambulatory Visit: Payer: Self-pay | Admitting: Physician Assistant

## 2023-04-02 MED ORDER — ACETAMINOPHEN-CODEINE 300-30 MG PO TABS: 1.0000 | ORAL_TABLET | Freq: Two times a day (BID) | ORAL | 0 refills | Status: AC | PRN

## 2023-04-02 NOTE — Telephone Encounter (Signed)
Too far out from surgery for narcotics.  I can send in tramadol or tylenol 3 if she would like?

## 2023-04-02 NOTE — Telephone Encounter (Signed)
sent 

## 2023-04-02 NOTE — Telephone Encounter (Signed)
Tylenol 3  [facility] [place]io [place]s

## 2023-04-03 ENCOUNTER — Ambulatory Visit: Payer: Medicare HMO | Admitting: Orthopaedic Surgery

## 2023-06-11 ENCOUNTER — Ambulatory Visit: Payer: Medicare HMO | Admitting: Physician Assistant

## 2023-07-08 ENCOUNTER — Other Ambulatory Visit: Payer: Self-pay

## 2023-07-08 ENCOUNTER — Encounter: Payer: Self-pay | Admitting: Physician Assistant

## 2023-07-08 ENCOUNTER — Ambulatory Visit: Payer: Medicare HMO | Admitting: Physician Assistant

## 2023-07-08 DIAGNOSIS — Z96641 Presence of right artificial hip joint: Secondary | ICD-10-CM

## 2023-07-08 NOTE — Progress Notes (Signed)
Post-Op Visit Note   Patient: Elizabeth Branch           Date of Birth: 1945-12-19           MRN: [id number] Visit Date: 07/08/2023 PCP: Ethelda Chick, MD   Assessment & Plan:  Chief Complaint:  Chief Complaint  Patient presents with   Right Hip - Follow-up    Right total hip arthroplasty 12/10/2022   Visit Diagnoses:  1. Status post total replacement of right hip     Plan: Patient is a pleasant 78 year old female who comes in today 6 months status post right total hip replacement 12/10/2022.  She is not in any pain.  She is continuing to work on exercises specifically with strengthening the right hip.  She is walking on a track which has seemed to help.  She is still using a cane but primarily in public only.  She tells me she has been using a cane for 20+ years prior to the surgery.  Overall, making good progress.  Examination of the right hip reveals painless hip flexion and logroll.  She does have a small leg length discrepancy.  At this point, she is doing well.  We have provided her with a few heel lifts to help with the leg length discrepancy.  She will follow-up in 6 months for repeat evaluation and AP pelvis x-rays.  Dental prophylaxis reinforced.  Call with concerns or questions.  Follow-Up Instructions: Return in about 3 months (around 10/05/2023).   Orders:  Orders Placed This Encounter  Procedures   XR Pelvis 1-2 Views   No orders of the defined types were placed in this encounter.   Imaging: No results found.  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   Past Medical History:  Diagnosis Date   Allergy    Arthritis    Asthma    Blood transfusion without reported diagnosis     Cataract    Family history of adverse reaction to anesthesia    Patient states her mother had a hard time waking up from anesthesia   Heart murmur    Hypertension    Seasonal allergies     Family History  Problem Relation Age of Onset   Colon cancer Cousin    Breast cancer Maternal Aunt     Past Surgical History:  Procedure Laterality Date   ABDOMINAL HYSTERECTOMY     APPENDECTOMY     BIOPSY TONGUE  11/2022   CATARACT EXTRACTION     PTERYGIUM EXCISION Left 11/2022   TONSILLECTOMY     TOTAL HIP ARTHROPLASTY Right 12/10/2022   Procedure: RIGHT TOTAL HIP ARTHROPLASTY ANTERIOR APPROACH;  Surgeon: Tarry Kos, MD;  Location: [facility] OR;  Service: Orthopedics;  Laterality: Right;  3-C   Social History   Occupational History   Not on file  Tobacco Use   Smoking status: Former    Current packs/day: 0.00    Types: Cigarettes    Quit date: 1984    Years since quitting: 41.1   Smokeless tobacco: Never  Substance and Sexual Activity   Alcohol use: Yes    Comment: occassional   Drug use: No   Sexual activity: Not on file

## 2023-10-26 ENCOUNTER — Emergency Department (HOSPITAL_COMMUNITY)

## 2023-10-26 ENCOUNTER — Other Ambulatory Visit: Payer: Self-pay

## 2023-10-26 ENCOUNTER — Emergency Department (HOSPITAL_COMMUNITY)
Admission: EM | Admit: 2023-10-26 | Discharge: 2023-10-26 | Disposition: A | Discharge status: Home / Self Care | Attending: Emergency Medicine | Admitting: Emergency Medicine

## 2023-10-26 ENCOUNTER — Encounter (HOSPITAL_COMMUNITY): Payer: Self-pay | Admitting: Emergency Medicine

## 2023-10-26 DIAGNOSIS — I10 Essential (primary) hypertension: Secondary | ICD-10-CM | POA: Insufficient documentation

## 2023-10-26 DIAGNOSIS — Z7982 Long term (current) use of aspirin: Secondary | ICD-10-CM | POA: Insufficient documentation

## 2023-10-26 DIAGNOSIS — Z79899 Other long term (current) drug therapy: Secondary | ICD-10-CM | POA: Insufficient documentation

## 2023-10-26 DIAGNOSIS — M79604 Pain in right leg: Secondary | ICD-10-CM | POA: Insufficient documentation

## 2023-10-26 LAB — URINALYSIS, ROUTINE W REFLEX MICROSCOPIC
Bilirubin Urine: NEGATIVE
Glucose, UA: NEGATIVE mg/dL
Hgb urine dipstick: NEGATIVE
Ketones, ur: NEGATIVE mg/dL
Leukocytes,Ua: NEGATIVE
Nitrite: NEGATIVE
Protein, ur: NEGATIVE mg/dL
Specific Gravity, Urine: 1.01 (ref 1.005–1.030)
pH: 5 (ref 5.0–8.0)

## 2023-10-26 LAB — BASIC METABOLIC PANEL WITH GFR
Anion gap: 9 (ref 5–15)
BUN: 27 mg/dL — ABNORMAL HIGH (ref 8–23)
CO2: 26 mmol/L (ref 22–32)
Calcium: 9.4 mg/dL (ref 8.9–10.3)
Chloride: 101 mmol/L (ref 98–111)
Creatinine, Ser: 0.99 mg/dL (ref 0.44–1.00)
GFR, Estimated: 58 mL/min — ABNORMAL LOW (ref >60)
Glucose, Bld: 120 mg/dL — ABNORMAL HIGH (ref 70–99)
Potassium: 3.6 mmol/L (ref 3.5–5.1)
Sodium: 136 mmol/L (ref 135–145)

## 2023-10-26 LAB — CBC
HCT: 41.9 % (ref 36.0–46.0)
Hemoglobin: 13.9 g/dL (ref 12.0–15.0)
MCH: 29.5 pg (ref 26.0–34.0)
MCHC: 33.2 g/dL (ref 30.0–36.0)
MCV: 89 fL (ref 80.0–100.0)
Platelets: 230 10*3/uL (ref 150–400)
RBC: 4.71 MIL/uL (ref 3.87–5.11)
RDW: 12.4 % (ref 11.5–15.5)
WBC: 5.5 10*3/uL (ref 4.0–10.5)
nRBC: 0 % (ref 0.0–0.2)

## 2023-10-26 LAB — D-DIMER, QUANTITATIVE: D-Dimer, Quant: 1.11 ug{FEU}/mL — ABNORMAL HIGH (ref 0.00–0.50)

## 2023-10-26 MED ORDER — METHOCARBAMOL 500 MG PO TABS: 500.0000 mg | ORAL_TABLET | Freq: Two times a day (BID) | ORAL | 0 refills | Status: AC | PRN

## 2023-10-26 MED ORDER — OXYCODONE-ACETAMINOPHEN 5-325 MG PO TABS
2.0000 | ORAL_TABLET | Freq: Once | ORAL | Status: DC
  Filled 2023-10-26: qty 2

## 2023-10-26 MED ORDER — CELECOXIB 100 MG PO CAPS: 100.0000 mg | ORAL_CAPSULE | Freq: Two times a day (BID) | ORAL | 0 refills | Status: AC

## 2023-10-26 NOTE — ED Triage Notes (Signed)
 Pt bib [facility] w/ c/o right leg pain. Pt states she is unable to bare any weight on leg due to pain. Pt endorses swelling in the right leg. Swelling is not visible to Clinical research associate. Pt is normally ambulatory. Pt is A&OX4

## 2023-10-26 NOTE — ED Provider Notes (Signed)
 [place] [facility] AT [facility] Provider Note   CSN: [id number] Arrival date & time: 10/26/23  0745     History  Chief Complaint  Patient presents with   Leg Pain    Elizabeth Branch is a 78 y.o. female.  HPI   This patient is a very pleasant 78 year old female, she has a history of hypertension on hydrochlorothiazide , she takes pain medications including Percocet, she has never had a blood clot but states that she has had occasional atrial fibrillation.  She is not anticoagulated.  She states that she was up 7 or 8 times overnight to urinate and at 530 this morning when she tried to urinate and walk back and forth between the bedroom and the bathroom she developed acute onset of pain behind her right knee.  This is worse with ambulation and moving the leg, she feels like it is associated with swelling of the leg.  She denies a specific injury that caused this, she called the paramedics for transport, they noted unremarkable vital signs other than some hypertension  Home Medications Prior to Admission medications   Medication Sig Start Date End Date Taking? Authorizing Provider  celecoxib (CELEBREX) 100 MG capsule Take 1 capsule (100 mg total) by mouth 2 (two) times daily. 10/26/23  Yes Early Glisson, MD  methocarbamol  (ROBAXIN ) 500 MG tablet Take 1 tablet (500 mg total) by mouth 2 (two) times daily as needed for muscle spasms. 10/26/23  Yes Early Glisson, MD  acetaminophen -codeine  (TYLENOL  #3) 300-30 MG tablet Take 1 tablet by mouth 2 (two) times daily as needed for moderate pain (pain score 4-6). 04/02/23   Sandie Cross, PA-C  albuterol  (PROVENTIL  HFA;VENTOLIN  HFA) 108 (90 Base) MCG/ACT inhaler Inhale 1 puff into the lungs every 6 (six) hours as needed for wheezing or shortness of breath.    [provider]  aspirin  EC 81 MG tablet Take 1 tablet (81 mg total) by mouth 2 (two) times daily as needed. To be taken after surgery to prevent blood clots  12/11/22 12/11/23  Sandie Cross, PA-C  bismuth subsalicylate (PEPTO BISMOL) 262 MG chewable tablet Chew 524 mg by mouth as needed for indigestion or diarrhea or loose stools.    [provider]  docusate sodium  (COLACE) 100 MG capsule Take 1 capsule (100 mg total) by mouth daily as needed. 12/03/22 12/03/23  Sandie Cross, PA-C  hydrochlorothiazide  (HYDRODIURIL ) 25 MG tablet Take 50 mg by mouth daily.    [provider]  magnesium  oxide (MAG-OX) 400 MG tablet Take 400 mg by mouth at bedtime.    [provider]  montelukast  (SINGULAIR ) 10 MG tablet Take 10 mg by mouth at bedtime.    [provider]  ondansetron  (ZOFRAN ) 4 MG tablet Take 1 tablet (4 mg total) by mouth every 8 (eight) hours as needed for nausea or vomiting. 12/03/22   Sandie Cross, PA-C  oxyCODONE -acetaminophen  (PERCOCET) 5-325 MG tablet Take 1-2 tablets by mouth every 6 (six) hours as needed. To  be taken after surgery 12/11/22   Sandie Cross, PA-C  Polyethyl Glycol-Propyl Glycol 0.4-0.3 % SOLN Place 1 drop into both eyes as needed (itching eyes).    [provider]  Prenatal Vit-Fe Fumarate-FA (PRENATAL PO) Take 1 tablet by mouth daily.    [provider]  PRESCRIPTION MEDICATION Apply 1 Application topically as needed (dry skin/eczema). Unknown cream    [provider]  Turmeric 450 MG CAPS Take 450 mg by mouth  daily.    [provider]  Vitamins-Lipotropics (LIPO FLAVONOID PLUS PO) Take 2-3 tablets by mouth daily.    [provider]  White Petrolatum-Mineral Oil (EYE LUBRICANT) OINT Place 1 Application into both eyes as needed (itching).    [provider]  zinc gluconate 50 MG tablet Take 50 mg by mouth at bedtime.    [provider]      Allergies    Codeine , Naproxen, Egg-derived products, Penicillins, and Pineapple    Review of Systems   Review of Systems  All other systems reviewed and are negative.   Physical  Exam Updated Vital Signs BP (!) 142/50   Pulse 60   Temp 98 F (36.7 C)   Ht 1.575 m (5\' 2" )   Wt 75.8 kg   SpO2 97%   BMI 30.54 kg/m  Physical Exam Vitals and nursing note reviewed.  Constitutional:      General: She is not in acute distress.    Appearance: She is well-developed.  HENT:     Head: Normocephalic and atraumatic.     Mouth/Throat:     Pharynx: No oropharyngeal exudate.  Eyes:     General: No scleral icterus.       Right eye: No discharge.        Left eye: No discharge.     Conjunctiva/sclera: Conjunctivae normal.     Pupils: Pupils are equal, round, and reactive to light.  Neck:     Thyroid: No thyromegaly.     Vascular: No JVD.  Cardiovascular:     Rate and Rhythm: Normal rate and regular rhythm.     Heart sounds: Normal heart sounds. No murmur heard.    No friction rub. No gallop.  Pulmonary:     Effort: Pulmonary effort is normal. No respiratory distress.     Breath sounds: Normal breath sounds. No wheezing or rales.  Abdominal:     General: Bowel sounds are normal. There is no distension.     Palpations: Abdomen is soft. There is no mass.     Tenderness: There is no abdominal tenderness.  Musculoskeletal:        General: Tenderness present. Normal range of motion.     Cervical back: Normal range of motion and neck supple.     Right lower leg: No edema.     Left lower leg: No edema.     Comments: Normal passive range of motion, the patient can straight leg raise with normal strength, she has diffusely normal sensation, she has normal strength at the bilateral ankles with both plantarflexion and dorsiflexion, she can flex and extend the knee on the right but with some tenderness behind the right knee at the proximal calf muscle.  Lymphadenopathy:     Cervical: No cervical adenopathy.  Skin:    General: Skin is warm and dry.     Findings: No erythema or rash.  Neurological:     Mental Status: She is alert.     Coordination: Coordination normal.   Psychiatric:        Behavior: Behavior normal.     ED Results / Procedures / Treatments   Labs (all labs ordered are listed, but only abnormal results are displayed) Labs Reviewed  D-DIMER, QUANTITATIVE - Abnormal; Notable for the following components:      Result Value   D-Dimer, Quant 1.11 (*)    All other components within normal limits  BASIC METABOLIC PANEL WITH GFR - Abnormal; Notable for the following components:  Glucose, Bld 120 (*)    BUN 27 (*)    GFR, Estimated 58 (*)    All other components within normal limits  URINALYSIS, ROUTINE W REFLEX MICROSCOPIC - Abnormal; Notable for the following components:   APPearance HAZY (*)    All other components within normal limits  CBC    EKG None  Radiology US  Venous Img Lower Unilateral Right Result Date: 10/26/2023 CLINICAL DATA:  Right lower extremity pain and edema. EXAM: RIGHT LOWER EXTREMITY VENOUS DOPPLER ULTRASOUND TECHNIQUE: Gray-scale sonography with graded compression, as well as color Doppler and duplex ultrasound were performed to evaluate the lower extremity deep venous systems from the level of the common femoral vein and including the common femoral, femoral, profunda femoral, popliteal and calf veins including the posterior tibial, peroneal and gastrocnemius veins when visible. The superficial great saphenous vein was also interrogated. Spectral Doppler was utilized to evaluate flow at rest and with distal augmentation maneuvers in the common femoral, femoral and popliteal veins. COMPARISON:  None Available. FINDINGS: Contralateral Common Femoral Vein: Respiratory phasicity is normal and symmetric with the symptomatic side. No evidence of thrombus. Normal compressibility. Common Femoral Vein: No evidence of thrombus. Normal compressibility, respiratory phasicity and response to augmentation. Saphenofemoral Junction: No evidence of thrombus. Normal compressibility and flow on color Doppler imaging. Profunda Femoral  Vein: No evidence of thrombus. Normal compressibility and flow on color Doppler imaging. Femoral Vein: No evidence of thrombus. Normal compressibility, respiratory phasicity and response to augmentation. Popliteal Vein: No evidence of thrombus. Normal compressibility, respiratory phasicity and response to augmentation. Calf Veins: No evidence of thrombus. Normal compressibility and flow on color Doppler imaging. Superficial Great Saphenous Vein: No evidence of thrombus. Normal compressibility. Venous Reflux:  None. Other Findings: No evidence of superficial thrombophlebitis or abnormal fluid collection. IMPRESSION: No evidence of right lower extremity deep venous thrombosis. Electronically Signed   By: Erica Hau M.D.   On: 10/26/2023 11:20   DG Knee Complete 4 Views Right Result Date: 10/26/2023 CLINICAL DATA:  Trauma.  Swelling. EXAM: RIGHT KNEE - COMPLETE 4+ VIEW COMPARISON:  None FINDINGS: No joint effusion. No signs of acute fracture or dislocation. Sharpening of the tibial spines. Chondrocalcinosis noted. Soft tissues are unremarkable. IMPRESSION: 1. No acute findings. 2. Mild osteoarthritis. 3. Chondrocalcinosis. Electronically Signed   By: Kimberley Penman M.D.   On: 10/26/2023 09:53    Procedures Procedures    Medications Ordered in ED Medications  oxyCODONE -acetaminophen  (PERCOCET/ROXICET) 5-325 MG per tablet 2 tablet (2 tablets Oral Patient Refused/Not Given 10/26/23 0831)    ED Course/ Medical Decision Making/ A&P                                 Medical Decision Making Amount and/or Complexity of Data Reviewed Labs: ordered. Radiology: ordered.  Risk Prescription drug management.    This patient presents to the ED for concern of leg pain and swelling differential diagnosis includes muscle strain, muscle tear, DVT, Baker's cyst, the patient does not have asymmetrical legs in fact when I measured her calves they measure at 36 cm bilaterally    Additional history  obtained:  Additional history obtained from medical record and the paramedic report External records from outside source obtained and reviewed including multiple prior office visits, prior right hip replacement following with orthopedics as recently as a year ago, last follow-up was in February of this year.   Lab Tests:  I Ordered, and  personally interpreted labs.  The pertinent results include: D-dimer elevated, CBC and metabolic panel unremarkable and reassuring, urinalysis without infection   Imaging Studies ordered:  I ordered imaging studies including DVT study negative, x-ray negative for fractures, arthritis present I independently visualized and interpreted imaging which showed arthritis but no DVT I agree with the radiologist interpretation   Medicines ordered and prescription drug management:  I ordered medication including patient given 2 Percocet, she refused stating that she is allergic,  Reevaluation of the patient after these medicines showed that the patient unchanged I have reviewed the patients home medicines and have made adjustments as needed   Problem List / ED Course:  Patient with leg pain, seems to be muscular, will be given muscle relaxer and celecoxib   Social Determinants of Health:  None           Final Clinical Impression(s) / ED Diagnoses Final diagnoses:  Right leg pain    Rx / DC Orders ED Discharge Orders          Ordered    celecoxib (CELEBREX) 100 MG capsule  2 times daily        10/26/23 1234    methocarbamol  (ROBAXIN ) 500 MG tablet  2 times daily PRN        10/26/23 1234              Early Glisson, MD 10/26/23 1236

## 2023-10-26 NOTE — ED Notes (Signed)
Attempted to call ultrasound x 2, no answer.

## 2023-10-26 NOTE — Discharge Instructions (Signed)
 Your testing has been unremarkable and reassuring, no issues with your bones, no blood clot, you can take celecoxib twice a day as needed, Robaxin  is a muscle relaxer that may help as well, I would recommend some cold compresses and rest, ER for severe worsening symptoms  Thank you for allowing us  to treat you in the emergency department today.  After reviewing your examination and potential testing that was done it appears that you are safe to go home.  I would like for you to follow-up with your doctor within the next several days, have them obtain your records and follow-up with them to review all potential tests and results from your visit.  If you should develop severe or worsening symptoms return to the emergency department immediately

## 2023-12-03 ENCOUNTER — Ambulatory Visit: Payer: Medicare HMO | Admitting: Physician Assistant

## 2023-12-03 ENCOUNTER — Other Ambulatory Visit (INDEPENDENT_AMBULATORY_CARE_PROVIDER_SITE_OTHER): Payer: Self-pay

## 2023-12-03 DIAGNOSIS — Z96641 Presence of right artificial hip joint: Secondary | ICD-10-CM

## 2023-12-03 NOTE — Progress Notes (Signed)
   Post-Op Visit Note   Patient: Elizabeth Branch           Date of Birth: 1946-04-19           MRN: [id number] Visit Date: 12/03/2023 PCP: Henry Fitch, MD   Assessment & Plan:  Chief Complaint:  Chief Complaint  Patient presents with   Right Hip - Follow-up    Right total hip arthroplasty 12/10/2022   Visit Diagnoses:  1. Status post total replacement of right hip     Plan: Patient is a pleasant 78 year old female who comes in today approximately 1 year status post right total hip replacement 12/10/2022.  She has been doing well in regards to the hip.  She did have calf pain back in May and was seen in the ED where DVT was ruled out.  Examination of her right hip reveals painless hip flexion and logroll.  She is neurovascularly intact distally.  At this point, she will continue to advance with activity as tolerated.  Dental prophylaxis reinforced for another year.  Follow-up in 1 year for repeat evaluation and AP pelvis x-rays.  Call with concerns or questions.  Follow-Up Instructions: Return in about 1 year (around 12/02/2024).   Orders:  Orders Placed This Encounter  Procedures   XR Pelvis 1-2 Views   No orders of the defined types were placed in this encounter.   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   Past Medical History:  Diagnosis Date   Allergy    Arthritis    Asthma    Blood transfusion without reported diagnosis    Cataract    Family history of adverse reaction to anesthesia    Patient states her mother had a hard time waking up from anesthesia   Heart murmur     Hypertension    Seasonal allergies     Family History  Problem Relation Age of Onset   Colon cancer Cousin    Breast cancer Maternal Aunt     Past Surgical History:  Procedure Laterality Date   ABDOMINAL HYSTERECTOMY     APPENDECTOMY     BIOPSY TONGUE  11/2022   CATARACT EXTRACTION     PTERYGIUM EXCISION Left 11/2022   TONSILLECTOMY     TOTAL HIP ARTHROPLASTY Right 12/10/2022   Procedure: RIGHT TOTAL HIP ARTHROPLASTY ANTERIOR APPROACH;  Surgeon: Jerri Kay HERO, MD;  Location: [facility] OR;  Service: Orthopedics;  Laterality: Right;  3-C   Social History   Occupational History   Not on file  Tobacco Use   Smoking status: Former    Current packs/day: 0.00    Types: Cigarettes    Quit date: 1984    Years since quitting: 41.5   Smokeless tobacco: Never  Substance and Sexual Activity   Alcohol  use: Yes    Comment: occassional   Drug use: No   Sexual activity: Not on file

## 2024-04-07 ENCOUNTER — Encounter: Admitting: Orthopaedic Surgery

## 2024-04-24 ENCOUNTER — Other Ambulatory Visit: Payer: Self-pay | Admitting: Family Medicine

## 2024-04-24 DIAGNOSIS — M810 Age-related osteoporosis without current pathological fracture: Secondary | ICD-10-CM
